# Patient Record
Sex: Female | Born: 1935 | Race: White | Hispanic: No | Smoking: Never smoker
Health system: Southern US, Community
[De-identification: ages and names within clinical notes are randomized; demographics above are authoritative.]

## PROBLEM LIST (undated history)

## (undated) DIAGNOSIS — E039 Hypothyroidism, unspecified: Secondary | ICD-10-CM

## (undated) DIAGNOSIS — M1990 Unspecified osteoarthritis, unspecified site: Secondary | ICD-10-CM

## (undated) DIAGNOSIS — Z7901 Long term (current) use of anticoagulants: Secondary | ICD-10-CM

## (undated) DIAGNOSIS — Z9889 Other specified postprocedural states: Secondary | ICD-10-CM

## (undated) DIAGNOSIS — I25709 Atherosclerosis of coronary artery bypass graft(s), unspecified, with unspecified angina pectoris: Secondary | ICD-10-CM

## (undated) DIAGNOSIS — Z86718 Personal history of other venous thrombosis and embolism: Secondary | ICD-10-CM

## (undated) DIAGNOSIS — I459 Conduction disorder, unspecified: Secondary | ICD-10-CM

## (undated) DIAGNOSIS — E785 Hyperlipidemia, unspecified: Secondary | ICD-10-CM

## (undated) DIAGNOSIS — E119 Type 2 diabetes mellitus without complications: Secondary | ICD-10-CM

## (undated) DIAGNOSIS — M810 Age-related osteoporosis without current pathological fracture: Secondary | ICD-10-CM

## (undated) DIAGNOSIS — R001 Bradycardia, unspecified: Secondary | ICD-10-CM

## (undated) DIAGNOSIS — S72009A Fracture of unspecified part of neck of unspecified femur, initial encounter for closed fracture: Secondary | ICD-10-CM

## (undated) DIAGNOSIS — E7800 Pure hypercholesterolemia, unspecified: Secondary | ICD-10-CM

## (undated) DIAGNOSIS — I10 Essential (primary) hypertension: Secondary | ICD-10-CM

## (undated) DIAGNOSIS — M8580 Other specified disorders of bone density and structure, unspecified site: Secondary | ICD-10-CM

## (undated) DIAGNOSIS — I4892 Unspecified atrial flutter: Secondary | ICD-10-CM

## (undated) DIAGNOSIS — I2510 Atherosclerotic heart disease of native coronary artery without angina pectoris: Secondary | ICD-10-CM

## (undated) HISTORY — PX: CORONARY ARTERY BYPASS GRAFT: SHX141

## (undated) HISTORY — DX: Unspecified osteoarthritis, unspecified site: M19.90

## (undated) HISTORY — PX: JOINT REPLACEMENT: SHX530

## (undated) HISTORY — DX: Other specified disorders of bone density and structure, unspecified site: M85.80

## (undated) HISTORY — DX: Long term (current) use of anticoagulants: Z79.01

## (undated) HISTORY — PX: APPENDECTOMY: SHX54

## (undated) HISTORY — DX: Age-related osteoporosis without current pathological fracture: M81.0

## (undated) HISTORY — DX: Unspecified atrial flutter: I48.92

## (undated) HISTORY — DX: Type 2 diabetes mellitus without complications: E11.9

## (undated) HISTORY — PX: FRACTURE SURGERY: SHX138

## (undated) HISTORY — DX: Atherosclerotic heart disease of native coronary artery without angina pectoris: I25.10

## (undated) HISTORY — DX: Conduction disorder, unspecified: I45.9

## (undated) HISTORY — PX: HEMORRHOID SURGERY: SHX153

## (undated) HISTORY — DX: Atherosclerosis of coronary artery bypass graft(s), unspecified, with unspecified angina pectoris: I25.709

## (undated) HISTORY — DX: Essential (primary) hypertension: I10

## (undated) HISTORY — DX: Pure hypercholesterolemia, unspecified: E78.00

## (undated) HISTORY — DX: Hypothyroidism, unspecified: E03.9

## (undated) HISTORY — PX: EYE SURGERY: SHX253

## (undated) HISTORY — DX: Fracture of unspecified part of neck of unspecified femur, initial encounter for closed fracture: S72.009A

## (undated) HISTORY — DX: Personal history of other venous thrombosis and embolism: Z86.718

## (undated) HISTORY — DX: Hyperlipidemia, unspecified: E78.5

## (undated) HISTORY — DX: Other specified postprocedural states: Z98.890

## (undated) HISTORY — DX: Bradycardia, unspecified: R00.1

---

## 2000-04-24 ENCOUNTER — Encounter: Payer: Self-pay | Admitting: Specialist

## 2000-05-02 ENCOUNTER — Inpatient Hospital Stay (HOSPITAL_COMMUNITY): Admission: RE | Admit: 2000-05-02 | Discharge: 2000-05-06 | Payer: Self-pay | Admitting: Specialist

## 2000-05-02 ENCOUNTER — Encounter: Payer: Self-pay | Admitting: Orthopedic Surgery

## 2001-09-07 ENCOUNTER — Ambulatory Visit (HOSPITAL_COMMUNITY): Admission: RE | Admit: 2001-09-07 | Discharge: 2001-09-07 | Payer: Self-pay | Admitting: Interventional Cardiology

## 2002-04-20 ENCOUNTER — Encounter: Admission: RE | Admit: 2002-04-20 | Discharge: 2002-04-20 | Payer: Self-pay | Admitting: Family Medicine

## 2002-04-20 ENCOUNTER — Encounter: Payer: Self-pay | Admitting: Family Medicine

## 2002-06-03 ENCOUNTER — Other Ambulatory Visit: Admission: RE | Admit: 2002-06-03 | Discharge: 2002-06-03 | Payer: Self-pay | Admitting: Obstetrics & Gynecology

## 2004-07-30 ENCOUNTER — Ambulatory Visit (HOSPITAL_COMMUNITY): Admission: RE | Admit: 2004-07-30 | Discharge: 2004-07-30 | Payer: Self-pay | Admitting: Gastroenterology

## 2005-02-04 ENCOUNTER — Inpatient Hospital Stay (HOSPITAL_BASED_OUTPATIENT_CLINIC_OR_DEPARTMENT_OTHER): Admission: RE | Admit: 2005-02-04 | Discharge: 2005-02-04 | Payer: Self-pay | Admitting: Interventional Cardiology

## 2005-02-27 ENCOUNTER — Ambulatory Visit: Payer: Self-pay | Admitting: Physical Medicine & Rehabilitation

## 2005-02-27 ENCOUNTER — Inpatient Hospital Stay (HOSPITAL_COMMUNITY): Admission: RE | Admit: 2005-02-27 | Discharge: 2005-03-06 | Payer: Self-pay | Admitting: Specialist

## 2007-07-15 ENCOUNTER — Other Ambulatory Visit: Admission: RE | Admit: 2007-07-15 | Discharge: 2007-07-15 | Payer: Self-pay | Admitting: Obstetrics and Gynecology

## 2011-03-15 NOTE — Discharge Summary (Signed)
NAMELENOIR, FACCHINI NO.:  [id number]   MEDICAL RECORD NO.:  [id number]          PATIENT TYPE:  INP   LOCATION:  5035                         FACILITY:  [facility]   PHYSICIAN:  Kerrin Champagne, M.D.   DATE OF BIRTH:  19-Aug-1926   DATE OF ADMISSION:  02/27/2005  DATE OF DISCHARGE:  03/06/2005                                 DISCHARGE SUMMARY   ADMISSION DIAGNOSES:  1.  Severe right knee osteoarthritis.  2.  Hypertension.  3.  Hypothyroidism.  4.  Diabetes mellitus.  5.  Coronary artery disease.  6.  Gastroesophageal reflux.  7.  Status post left total knee arthroplasty.   DISCHARGE DIAGNOSES:  1.  Severe right knee osteoarthritis.  2.  Hypertension.  3.  Hypothyroidism.  4.  Diabetes mellitus.  5.  Coronary artery disease.  6.  Gastroesophageal reflux.  7.  Status post left total knee arthroplasty.  8.  Postoperative anemia.   PROCEDURE:  On Feb 27, 2005, the patient underwent right cemented [employer]  total knee arthroplasty. This was performed by Dr. Otelia Sergeant, assisted by Maud Deed, P.A.C. under general anesthesia.   CONSULTATIONS:  [facility].   BRIEF HISTORY:  The patient 77 year old female with chronic and progressive  right knee pain. Conservative management is no longer giving her any  comfort. She has utilized anti-inflammatory medications as well as a intra-  articular injections and Synvisc injections. She is now having night pain  and inability to ambulate any distance. Radiographs demonstrate severe  medial joint line narrowing involving the right knee with varus deformity  and loss of extension of nearly 10 degrees with flexion only to 100 degrees.  It was thought that she would require surgical intervention and was admitted  for the procedure as stated above.   BRIEF HOSPITAL COURSE:  The patient tolerated the procedure under general  anesthesia without complications. She was placed on Coumadin for DVT and PE  prophylaxis  postoperatively. Adjustments in Coumadin dose were made  according to daily pro times by the pharmacist at [facility]. She  was placed on the usual physical therapy routine for ambulation and gait  training as well as range of motion and strengthening exercises. She was  allowed weightbearing as tolerated on the operative extremity. The patient's  Hemovac drain was discontinued on the first postoperative day and her  dressing was changed daily thereafter. No significant drainage noted. The  patient utilizes CPM machine for range of motion and tolerated this well.   The patient was started on Protonix for her reflux. Her blood sugars were  not felt to be stable and therefore a consult was obtained from the  [facility]. Adjustments were made in her oral agents. She did not  require use of a sensitive sliding scale insulin. The patient was initially  slow with her therapy and a rehabilitation consult was obtained.  Fortunately, as she began feeling better, her activity increased as well.  The patient dropped her hemoglobin to 7.7 with hematocrit 31 requiring 3  units of packed red blood cells. Doppler studies were obtained of the  right  lower extremity secondary to edema which were negative. The patient did have  a local cellulitis at the left wrist and was treated with oral Keflex.   On Mar 06, 2005, the patient was afebrile. She was voiding well and taking  oral fluids well. Blood sugars were stabilizing. She was felt stable for  discharge to her home.   PERTINENT LABORATORY VALUES:  Admission CBC within normal limits. Hemoglobin  12.8, hematocrit 37.3 on admission. Hemoglobin dropped to the lowest value  of 7.7 with hematocrit 21.7. The patient was transfused with 3 units of  packed red blood cells and results of hemoglobin and hematocrit were then  noted to be 11.5 and 32.9. Coagulation studies on admission were within  normal limits. At discharge, INR was 2.5.  Chemistry studies on admission  were normal with exception of glucose 208. Her blood sugars did range from  218-155 during the hospital stay. Hemoglobin A1c was noted to be 7.3. The  patient did have a couple episodes of hyponatremia as well as hypokalemia  which was treated with IV fluids and supplementation. Urinalysis on  admission was with too numerous to count bacteria, positive nitrite. Repeat  on Feb 28, 2005 was negative for urinary tract infection. Urine culture was  without growth. Chest x-ray on Mar 04, 2005 negative chest for active disease  and borderline heart size. Mar 01, 2005, chest x-ray showed diminished  aeration minimally, markings at the left lung base, probable atelectasis,  cardiomegaly. EKG on admission showed sinus rhythm with premature  ventricular complexes or fusion complexes, ST and T-wave abnormality;  consider inferolateral ischemia, abnormal EKG confirmed by Dr. Jenne Campus.  Doppler studies on Mar 04, 2005 were negative for DVT.   CONDITION ON DISCHARGE:  Stable.   PLAN:  The patient was given prescriptions for Glucophage, Keflex, enteric  coated aspirin, K-Lor, Trinsicon, Vicodin, and Protonix to be used as  directed. She will continue with ice to her knee and will stop using her  knee immobilizer. She will utilize analgesics for discomfort. She is  encouraged to walk as tolerated. She is to work on active range of motion  with physical therapy. The patient will follow up with Dr. Otelia Sergeant two weeks  from the date of her surgery. She will be allowed to shower with daily  dressing changes done at home. Home health physical therapy will assist her  with an exercise routine. She will continue on a diabetic diet. We will see  back in two weeks from the date of her surgery. All questions encouraged and  answered.      Wende Neighbors, P.A.      Kerrin Champagne, M.D.  Electronically Signed   SMV/MEDQ  D:  07/25/2005  T:  07/25/2005  Job:  [id number]

## 2011-03-15 NOTE — Consult Note (Signed)
[name], [name]              ACCOUNT NO.:  [id number]   MEDICAL RECORD NO.:  [id number]          PATIENT TYPE:  INP   LOCATION:  5035                         FACILITY:  [facility]   PHYSICIAN:  Toby L. Fugate, D.O.   DATE OF BIRTH:  June 10, 1926   DATE OF CONSULTATION:  DATE OF DISCHARGE:                                   CONSULTATION   DATE OF CONSULTATION:  Mar 03, 2005.   HISTORY OF PRESENT ILLNESS:  [name] is a 77 year old Caucasian female  with severe osteoarthritis admitted for a scheduled right total knee  arthroplasty on May 3rd.  The patient tolerated the procedure well.  Her  postop course has been essentially uneventful.  However, the patient has  continually had increased blood glucose.  Her blood sugars have ranged from  the mid 100s up to the mid 200s.  She does have a history of diabetes type 2  for which she receives Metformin 500 mg p.o. b.i.d.  She says that in the  past while at home, her blood sugars have been controlled with this regimen  of Metformin.  She says that usually her blood sugars range in the mid 100s.  In addition to medication, the patient exercises regularly and follows a low  sugar diet.  In addition to the uncontrolled blood sugars, the patient did  have a fever yesterday of 101.8.  The patient denies any shortness of breath  or cough.  In addition, she denies any urinary symptoms.  The wound shows no  evidence of infection.  She denies headache, changes in vision, sore throat,  and diarrhea.   PAST MEDICAL HISTORY/PAST SURGICAL HISTORY:  1.  Coronary artery disease status post CABG (four vessels) in 1993.  2.  Hypercholesterolemia.  3.  Hypothyroidism.  4.  Osteoarthritis.  5.  Diabetes type 2.  6.  Left total knee arthroplasty in 2001.  7.  Right total knee arthroplasty this admission.  8.  Hysterectomy.  9.  Appendectomy.   MEDICATIONS:  1.  Amlodipine 5 mg p.o. daily.  2.  Colace 100 mg p.o. b.i.d.  3.  Hydrochlorothiazide 25 mg  p.o. daily.  4.  Sliding scale insulin.  5.  Synthroid 88 mcg p.o. daily.  6.  Metformin 500 mg p.o. b.i.d.  7.  Lopressor 500 mg p.o. daily.  8.  Protonix 400 mg IV daily.  9.  Zocor 5 mg p.o. daily.  10. Warfarin per pharmacy protocol.   ALLERGIES:  No known allergies.   SOCIAL HISTORY:  The patient is widowed.  She has four daughters, one of  which is mentally retarded.  The patient denies tobacco, alcohol, and IV  drug abuse.   FAMILY HISTORY:  Father died at age 75 due to influenza.  Mother died at age  93 due to a stroke.   REVIEW OF SYSTEMS:  A complete 12-point review of systems was obtained and  the review was negative except as stated in the HPI.   PHYSICAL EXAMINATION:  VITAL SIGNS:  T-max 101.8, T-current 98.1, pulse 85,  respiratory rate 20, blood pressure 127/72.  GENERAL:  The patient appears alert and oriented.  She is in no acute  distress.  HEENT:  Pupils were equally round and reactive to light.  Extraocular  muscles were intact.  There was no scleral icterus.  Tympanic membranes were  clear bilaterally, no erythema.  Oropharynx clear, moist, no erythema or  thrush.  NECK:  No JVD, no carotid bruit, no adenopathy.  HEART:  Regular rate and rhythm.  There is a 2/6 systolic ejection murmur  with radiation to the patient's axilla.  LUNGS:  Faint right lower lobe crackles.  Otherwise clear.  ABDOMEN:  Positive bowel sounds, nontender, nondistended.  EXTREMITIES:  Right knee incision site is clean.  There is no erythema.  There is 1+ edema bilaterally.  NEUROLOGICAL:  Cranial nerves II-XII are grossly intact.  There were no  focal deficits.  DTRs were 2/4 in all extremities.  Strength was 5/5 in all  extremities.   LABORATORY DATA:  White blood cell count 14.6, hemoglobin 8.5, hematocrit  24, platelets 170.  Sodium 135, potassium 3.6, chloride 104, CO2 26, BUN 13,  creatinine 1, glucose 204.  INR 2.4.   ASSESSMENT AND PLAN:  1.  Diabetes type 2.  At this  point, I will increase the patient's Metformin      to 850 mg p.o. b.i.d.  The patient wants to avoid adding any new      medications if possible.  I will check Accu-Cheks q.a.c. and q.h.s.  I      will cover her with a sensitive sliding scale insulin.  I will also      check a hemoglobin A1c.  2.  Fever and leukocytosis.  The etiology is unclear at this point.  There      are no focal symptoms.  She denies any shortness of breath or cough.      Her wound shows no evidence of infection.  She denies dysuria and      diarrhea.  If she spikes a fever again, I will order CBC, chest x-ray,      blood cultures, UA, and urine cultures.  3.  Hypercholesterolemia.  I will continue the patient on her current dose      of Zocor.  4.  Coronary artery disease.  I will continue the patient on her current      medicine regimen.  She denies any chest pain.  5.  Hypothyroidism.  The patient is currently on Synthroid.  I will continue      that regimen.      TLF/MEDQ  D:  03/03/2005  T:  03/03/2005  Job:  [id number]

## 2011-03-15 NOTE — Op Note (Signed)
NAMESHAKARA, TWEEDY NO.:  [id number]   MEDICAL RECORD NO.:  [id number]          PATIENT TYPE:  INP   LOCATION:  2899                         FACILITY:  [facility]   PHYSICIAN:  Kerrin Champagne, M.D.   DATE OF BIRTH:  20-Aug-1926   DATE OF PROCEDURE:  02/27/2005  DATE OF DISCHARGE:                                 OPERATIVE REPORT   PREOPERATIVE DIAGNOSIS:  Severe right knee osteoarthritis.   POSTOPERATIVE DIAGNOSIS:  Severe right knee osteoarthritis.   PROCEDURES:  Right cemented Osteonics total knee arthroplasty using a  Scorpio posterior cruciate sacrificing prosthesis with #7 femoral stem and  #7 femoral component and #7 tibial components, 12 mm flexed tibial tray and  a 26 mm patella.   SURGEON:  Kerrin Champagne, M.D.   ASSISTANT:  Wende Neighbors, P.A.   ANESTHESIA:  GOT by Dr. Gypsy Balsam supplemented with right femoral nerve block.   SPECIMENS:  None.   ESTIMATED BLOOD LOSS:  100 mL.   TOURNIQUET TIME:  Total tourniquet time at 280 mmHg was 1 hour and 30  minutes.   COMPLICATIONS:  None.  The patient returned to the PACU in satisfactory  condition.   HISTORY OF PRESENT ILLNESS:  The patient is a 77 year old female who has  been experiencing worsening right knee discomfort.  She has undergone  attempts at conservative management, including anti-inflammatory agents and  use of Synvisc, all without apparent relief of discomfort.  Experiencing  night pain, difficulty with standing and ambulation on the right leg,  popping, cracking sensation and difficulty starting to walk.  She is unable  to negotiate stairs due to pain.  Her radiographs have demonstrated severe  medial joint line narrowing involving the right knee with varus deformity,  loss of extension of nearly 10 degrees and flexion only 100 degrees.  She is  brought to the operating room for problems with severe right knee  osteoarthritis recalcitrant to conservative management.   INTRAOPERATIVE  FINDINGS:  As above.   DESCRIPTION OF PROCEDURE:  After adequate general anesthesia, the right  lower extremity with a lateral block at the upper thigh, tourniquet about  the right upper thigh and a foot holder at the base of the bed, the patient  underwent a standard prep with DuraPrep solution and preoperative  antibiotics.  Draped in the usual manner.  An iodine Vi-Drape was used.   The patient had marking out of the expected area for incision, elevation of  the leg and exsanguination with Esmarch bandage.  Tourniquet inflated to 280  mmHg.  A medial parapatellar incision in line with the median portion of the  knee through the skin and subcutaneous layers, total length 14 cm, through  the skin and subcutaneous layers, extending down to the quadriceps tendon  proximal into the patella and then incision straight through the external  retinaculum, preserving the peritenon layer for later reapproximation.  This  was then carried medially.  An incision was made into the quadriceps tendon  along its medial one-third.  Incision carried along the medial parapatellar  region directly into the knee joint through  the synovial layer, preserving a  cuff or reattachment, then along the medial border of the patellar tendon  directly down to proximal tibial along the medial aspect of the patellar  tendon to the patella and anterior tibial tubercle.  Both the medial and  lateral attachments into the proximal tibial were carefully developed,  excising the meniscus anteriorly along the medial meniscus and then along  the anterior and aspects of the lateral meniscus, debriding the posterior  patellar fat pad of about 50% of its fat.  The incision was carried into the  quadriceps mechanism proximally until the knee cap was able to be everted  and delivered laterally.  The knee was able to be brought into flexion at  this point.  Osteophytes were resected of both lateral and medial femoral  condyles within  the intercondylar notch.  A drill was then used to drill  into the distal intermedullary canal of the femur.  Then basically a canal  finder was used to find the canal.  Using an alignment guide, then the  distal cutting alignment guide was then placed in approximately 5 degrees of  valgus.  This then carefully aligned into the external rotation of about 10  degrees and pinned to the distal femur.  The intermedullary guide was then  removed and the transverse cut made, an approximately +2 cut of about 10 mm  off of the end of the femur.  The end of the femur was then measured and  measured exactly for a #7 component so that the end of the femur was then  marked with both the medial and lateral femoral condyle marked for placement  of the alignment guide for cutting of the coronal cuts and chamfer cut  block.  The block was then impacted onto the end of the femur and held in  place with clamps bilaterally and cutting then performed, first to the  anterior coronal cut and posterior coronal cuts, protecting the soft tissue  structures posteriorly while incising with an oscillating saw.  Posterior  chamfer cut then cut and then the anterior chamfer cut.  These provided  excellent blocks for cutting.  The block used for cutting was then removed  and evaluation determined that cuts were appropriate.   Attention then turned to the proximal tibia.  The anterior and posterior  cruciates were resected and the tibia able to be subluxed anteriorly using  McHale retractors.  The lateral aspect of the tibial plateau was able to be  exposed.  The menisci that were retained were resected both medially and  laterally without difficulty.  The popliteus tendon was preserved quite  nicely.   With this then the tibial surface was then carefully debrided of its tibial  tubercles and median eminence using an oscillating saw.  A #7 tray was then used to determine the location for placement of the drill hole in  the  proximal tibia for placement of the intermedullary guide.  First the  handheld canal finder was used to find the canal.  This was then irrigated  to provide adequate removal of the fat from the intermedullary region.   The alignment guide was then placed with the proximal tibial cutting guide  set at 0 degrees.   This was then aligned using the alignment rod, bisecting the ankle distally,  providing a correct degree of internal rotation.  The patient's proximal  tibial guide was then pinned to the proximal tibia in the correct position  and alignment 4  mm to be cut off of the lateral tibial plateau where the  patient had more bone than medially where it had been eroded by medial joint  line arthritis.  With the cutting guide then pinned into place and  approximated, all of the soft tissue structures were carefully protected and  the proximal tibial cut was performed using oscillating saw.  This was made  quite nicely.  The proximal tibial guide was then removed as were the pins  using its placement.   The distal femoral intercondylar cut was then made by placing the cutting  jig over the end of the femur and impacting it into place and then pinning  it appropriately.  Using the chisels to remove the anterior bone from the  anterior intercondylar notch region, then the distal intercondylar notch was  cut using first the osteotomes using a Leksell rongeur to remove any loose  bone and the impacting the remaining bone into the end of the femur into the  intermedullary canal hole region.  With this completed, then a trial was  performed using a #7 femoral component and a #7 tibial tray, first with a 10  mm and then a 12 mm trace.  The 12 mm provided excellent fit with good  maintenance of ligament stability, full extension and flexion to 110  degrees.  The patella was then cut using the patella cutting guides and a  reamer for 26 mm patellar implant.  This was carefully aligned to  allow for  medialization, but also to allow for enough adequate bone about the patellar  implant to keep it stable.  The reaming was performed to a 10 mm depth.  Then each of the drill holes were made, two medial and one lateral.  This  was then removed and the prosthesis placed.  Then bone and cartilage were  resected about the implant to ensure that adequate resection of the patella  had been performed in order to allow for the patellar implant to glide  nicely on the femoral implant surface.  With this completed, then  determination of the correct orientation of the tibial implant was  performed, this using the alignment guide, again bisecting the ankle  distally, ensuring that the tibial tray was correctly onto the tibial  plateau.  It seated quite well.  Flexion and extension maneuvers  demonstrated the plate not lifting off at any particular instance.  Therefore, the knee was brought into the full extension again with the alignment in place, bisecting the ankle.  Marks were then made over the  anterior aspect of the tibial tray where the alignment notches were present  for the permanent guide, as well as the next implant and placement of the  proximal tibial flange osteotome cutting mechanism.  The knee was then  brought into flexion.  Tibial tray removed.  Femoral component removed.  Patellar component removed.  The knee was flexed and the McHale's replaced  so that the proximal portion of the tibial was well exposed in a subluxed  manner.  The guide was then pinned to the proximal portion of the tibia in  the correct degree of rotation determined previously with the implants just  removed and the correct rotation placed.  The tibial tray was then carefully  pinned to the proximal portion of the tibia.  The upright cutting guide for  use of the flange osteotomes were then inserted.  Each of them were then  impacted into place up to a #7-9 cement  tibial tray.  This completed, the   upright rig along with the proximal tibial tray were removed.  Pins removed  appropriately.  Irrigation was then performed.  Careful inspection of the  posterior joints demonstrated no significant osteophytes retained over the  posterior portions of the femoral condyles.  Soft tissue debrided of any  loose debris posteriorly and laterally.  Cauterization performed in the area  of the expectorate geniculate vessels both medial, lateral and central  perforating.   Irrigation was then performed using pulse irrigant solution over the  subcutaneous layers, extending to the patellar recessed area and inside the  knee both over the femur and tibia, removing any loose debris and soft  tissue present with this.  After irrigation was completed, drying was  completed using clean, dry sponges.  Knee brought into flexion.  McHales  then replaced to allow for subluxation of the tibia.  Cement was mixed when  the permanent prostheses, both the #7 tibial and femoral components, were in  place, as well as the 26 mm patellar piece.  Following the mixing of cement  and it being in a semi-solid consistency, a putty consistency, cement was  then first placed into the area of the patella after marking the patella for  the correct orientation of the patellar pegs.  Once cement was in place,  additional cement was placed over the posterior aspect of the patella.  This  was then inserted into place and impacted into place.  Excess cement removed  from the circumference of the patellar implant interval.  Then the patellar  clamp was applied and pressure was applied to the patellar component,  squeezing additional cement out and removing this appropriately.  Attention  was then turned to the subluxed tibial area where drying was completed.  Cement was placed over the proximal tibia within the open holes for the peg  and the flanges.  This was carefully spread out and then the #7 cement tibial component then impacted  into place.  Excess cement from the  circumference of the proximal tibia was removed using [employer].  With  this completed, a 10 mm trial tibial tray was then placed and the knee then  relocated with the tibia  under the femur.  The end of the femur was then  exposed using retractors so that all soft tissue was removed from the cut  surface of the end of the femur.  Bone cement was then applied to the  anterior surface of the distal tibia, the distal area, as well as the  posterior chamfer cut region.  Additional cement had been applied to the  posterior tray so that the femoral implant for its implantation.  The  femoral component was then applied to the femur.  Careful attention was  placed on where the holes were at the end of the femur for the proper  alignment so these could be found and oriented.  The femur component was  then impacted into place.  The insertion device was removed.  Additional  polypropylene impactor was used to further impact the implant into good  position and alignment.  Excess cement was then removed from the  circumference of the femoral component, as well as the intercondylar notch  and the anterior surface of the implant and femur.  This was done carefully.  Once it was completed, then the knee was brought into full extension and  left in extension until cement had completely hardened.  This provided good  compression of the knee joint and impaction of the knee joint until the  cement had completely hardened.  Once the cement had completely hardened,  the knee was brought into extension.  Examination of the knee demonstrated  that the cement had hardened quite nicely.  There was no loose debris or  significant cement remaining that required debridement.   The range of motion of the knee was quite good.  Full extension and flexion  to 120 degrees.  The patellar component demonstrated no tendency to lateral  subluxation with single thumb pressure flexion  and extension.  With this  completed, the trial tibial tray was removed.  The posterior aspect of the  knee was hemostased using two portions of thrombin-soaked Gelfoam.  Then a  sponge was placed.  Following placement of the sponge, the tourniquet was  released.  The total tourniquet time was 1 hour and 30 minutes.  Holding the  sponges in place for a total of five minutes.  Then hemostasis was further  obtained beginning from superficial to deep using cauterization, cauterizing  small blood vessels and bleeders, posteriorly along the genicular area, this  also was carefully cauterized.  Small perforating genicula noted over the  posterior aspect of the knee joint.  This was cauterized was the lateral  genicular region.  There was no significant active bleeding present at the  end of hemostasis.  Bone wax was applied to the intercondylar notch region  of the distal femur to obtain further hemostasis.  A single Hemovac drain was placed in the depths of the incision, exiting out the anterolateral  aspect of the distal thigh.  The permanent prosthesis, a 12 mm insert, was  then brought onto the field after a trial with a 12 provided excellent  fixation and excellent range of motion with full extension and flexion to  120 degrees of stable medial and lateral knee stability.  With this then,  the permanent prosthesis was inserted into place and impacted into place,  seating it normally.  Inspection of the joint surface and the tray tibial  implant interval demonstrated to be excellent and well seated.  There was no  evidence of loosening here.  Hemostasis was quite nice.  There was no  significant bleeding for the remainder of the case.  With the Hemovac drain  in place, then the synovial layer was closely approximated with a running  stitch of 0 Vicryl suture.  The distal quadriceps tendon was approximated  with interrupted 1-0 Vicryl sutures.  As well, the medial retinaculum of the  knee was  approximated with interrupted 1-0 Vicryl sutures.  The patient's  external retinaculum of the knee and peritenon layer were approximated with  interrupted 0 and 2-0 Vicryl sutures.  The deep subcutaneous layers were  approximated with interrupted 0 and 2-0 Vicryl sutures.  The skin was closed  with a running subcuticular stitch of 4-0 Vicryl.  Tincture of Benzoin and  Steri-Strips applied and 4 x 4s and ABD pad were affixed to the skin with a  Kerlix and then Ace wrap applied from the patient's right foot to the upper  thigh.  Knee immobilizer was placed.  The patient was then returned to her  bed, reactivated, extubated and returned to the recovery room in  satisfactory condition.  All instrument and sponge counts were correct.      JEN/MEDQ  D:  02/27/2005  T:  02/27/2005  Job:  [id number]

## 2011-03-15 NOTE — Op Note (Signed)
NAME:  [name], [name] NO.:  [id number]   MEDICAL RECORD NO.:  [id number]          PATIENT TYPE:  AMB   LOCATION:  ENDO                         FACILITY:  [facility]   PHYSICIAN:  Graylin Shiver, M.D.   DATE OF BIRTH:  Aug 27, 1926   DATE OF PROCEDURE:  07/30/2004  DATE OF DISCHARGE:                                 OPERATIVE REPORT   PROCEDURE:  Colonoscopy.   INDICATIONS:  Screening.   Informed consent was obtained after explantation of the risks of bleeding,  infection, and perforation.   PREMEDICATIONS:  1.  Fentanyl 50 mcg IV.  2.  Versed 3 mg IV.   PROCEDURE:  With the patient in the left lateral decubitus position, a  rectal exam was performed.  No masses were felt. The Olympus colonoscope was  inserted into the rectum and advanced around a tortuous colon to the cecum.  Cecal landmarks were identified.  The cecum and ascending colon were normal,  the transverse colon normal.  The descending colon and sigmoid showed  extensive diverticulosis.  The rectum was normal.  She tolerated the  procedure well, without complications.   IMPRESSION:  Extensive diverticulosis of the left colon.      SFG/MEDQ  D:  07/30/2004  T:  07/30/2004  Job:  [id number]   cc:   Chales Salmon. Abigail Miyamoto, M.D.  [address]  [place]  [place] [address]  Fax: [phone]

## 2011-03-15 NOTE — H&P (Signed)
[facility]  Patient:    [name], [name]                       MRN: [id number] Adm. Date:  05/02/00 Attending:  Kerrin Champagne, M.D. Dictator:   Druscilla Brownie. Shela Nevin, P.A. CC:         Celso Sickle, M.D., [facility]             Chales Salmon. Abigail Miyamoto, M.D.                         History and Physical  DATE OF BIRTH:  17-May-1926  CHIEF COMPLAINT:  Pain in my knees, more so in the left than right.  HISTORY OF PRESENT ILLNESS:  This 77 year old lady has been seen by [place] for continuing problems concerning her knees.  She has been seen on and off over the years.  Glucosamine and chondroitin, as well as anti-inflammatories have been used.  Unfortunately, she really has not progressed with reduction of her knee pain.  She has developed severe varus deformities involving the medial compartment, as well as the patellofemoral joint.  Injections really have not helped as well.  Examination reveals crepitus range of motion, some mild synovitis and effusion.  X-rays have shown osteoarthritis with near bone-on-bone deformity in the medial joint, as well as the patellofemoral joint.  Due to these positive findings, it was felt that this patient would benefit from surgical intervention and is being admitted for total knee replacement arthroplasty of the left knee.  She has donated 2 units of autologous blood for this procedure.  Dr. Abigail Miyamoto is aware of this patients upcoming knee surgery and Dr. Verdis Prime has cleared her for surgery.  PAST MEDICAL HISTORY:  This lady has really been in good health throughout her lifetime.  She did have a CABG in 1993 with excellent results.   She has had appendectomy, hysterectomy, and hemorrhoidectomy.  She has hypertension and hypothyroidism.  CURRENT MEDICATIONS:  1. Lopressor 50 mg 1 q.d.  2. Synthroid 0.088 mcg per day (mg ?).  3. Zocor 5 mg 1 q.d.  4. Premarin 0.625 mg 1 q.d.  5. Multivitamin, aspirin,  and vitamin E.  She will stop the aspirin and vitamin E prior to surgery.  SOCIAL HISTORY:  No intake of tobacco or alcohol products.  She does have a handicapped child at home.  Her daughter, Tacey Ruiz, will be her primary spokesperson during this hospitalization.  FAMILY HISTORY:  Noncontributory.  ALLERGIES:  No known drug allergies.  REVIEW OF SYSTEMS:  CNS: No seizure disorder, paralysis, numbness, or double vision.  Respiratory:  No productive cough.  No hemoptysis.  No shortness of breath.  Cardiovascular: No chest pain.  No angina.  No orthopnea. Gastrointestinal:  No nausea, vomiting, melena, or bloody stools. Genitourinary:  No discharge, dysuria, or hematuria.  Musculoskeletal: Primarily in present illness with her knees.  PHYSICAL EXAMINATION:  GENERAL:  Alert, cooperative, friendly, 77 year old, white female who is accompanied by her daughter.  VITAL SIGNS:  Blood pressure 138/88, pulse 74, respirations 12.  HEENT:  Normocephalic.  PERRLA.  Oropharynx is clear.  CHEST:  Clear to auscultation.  No rhonchi.  No rales.  No wheezes.  HEART:  Regular rate and rhythm.  No murmurs are heard.  ABDOMEN:  Soft and nontender.  Liver and spleen not felt.  GENITALIA, RECTAL, PELVIC, BREASTS:  Not done.  Not pertinent  to present illness.  EXTREMITIES:  Left knee as in present illness above.  ADMITTING DIAGNOSES:  1. Left knee osteoarthritis.  2. Status post coronary artery bypass grafting.  3. Hypothyroidism.  4. Hypertension.  PLAN:  The patient will be admitted for total knee replacement arthroplasty to the left knee.  We will ask Dr. Abigail Miyamoto and Dr. Katrinka Blazing to follow along with [place] should the patient have any medical or cardiology problems.  We will see how the patient does during her acute hospitalization to see if she needs a rehab consult or to have home physical therapy.  She has donated 2 units of autologous blood for this procedure. DD:  04/24/00 TD:   04/24/00 Job: [id number]

## 2011-03-15 NOTE — Cardiovascular Report (Signed)
[place]. [facility]  Patient:    [name], [name] Visit Number: [id number] MRN: [id number]          Service Type: Attending:  Darci Needle, M.D. Dictated by:   Darci Needle, M.D. Proc. Date: 09/07/01   CC:         Chales Salmon. Abigail Miyamoto, M.D.             Gwenith Daily Tyrone Sage, M.D.                        Cardiac Catheterization  INDICATION FOR PROCEDURE: Recent Cardiolite study demonstrating new apical ischemia.  PROCEDURES PERFORMED: 1. Left heart catheterization. 2. Selective coronary angiography. 3. Bypass graft angiography. 4. Internal mammary artery angiography.  DESCRIPTION OF PROCEDURE: After informed consent, a 6 French sheath was started in the right femoral artery using a modified Seldinger technique.  A 6 French A2 multipurpose catheter was used for hemodynamic recordings, left ventriculography and selective left and right coronary angiography. A multipurpose catheter was also used for saphenous vein graft angiography. We used an internal mammary artery catheter for a nonselective visualization of the left internal mammary.  The patient tolerated the procedure without complications.  RESULTS:  I: HEMODYNAMIC DATA:     a. The aortic pressure was 163/76 mmHg.     b. Left ventricular pressure 163/17 mmHg.  II: LEFT VENTRICULOGRAPHY: The left ventricle is normal in size. There is an inferior wall hypokinesis. No MR is noted. EF is estimated to be 50%.  III: CORONARY ANGIOGRAPHY:     a. Left main coronary: Widely patent.     b. Left anterior descending coronary artery: The LAD is large. After        the second septal perforator there is a 95-99% stenosis. Faint        competitive flow is noted with the distal internal mammary artery        graft. The first diagonal arises before the second septal perforator        and contains a 95% mid vessel stenosis. This diagonal is very small.        The second diagonal branch is also small and  arises from within the        99 plus percent stenosis in the mid LAD.     c. Circumflex artery: The circumflex coronary artery is totally occluded        after a small second obtuse marginal. The first obtuse marginal is        large and supplied by a side-to-side anastomosis from a saphenous vein        graft. Competitive flow is noted. The distal circumflex is not        visualized beyond the small second obtuse marginal.     d. Right coronary artery: The right coronary artery contains a segmental        99% stenosis in then mid vessel. There is distal right coronary        competitive flow with the bypass graft.  IV: BYPASS GRAFT ANGIOGRAPHY:     a. Saphenous vein graft sequential to the first and third obtuse        marginals: This graft is widely patent. The distal third obtuse        marginal contains severe diffuse disease with a 95-99% stenosis        near the apical segment.     b. Saphenous vein graft  to the right coronary artery: This graft is        widely patent with minimal if any irregularities and supplies the        distal right coronary which is also free of any significant        obstruction.  V: LEFT INTERNAL MAMMARY ARTERY GRAFT TO LEFT ANTERIOR DESCENDING: Because of proximal tortuosity in the left subclavian this graft was never selectively cannulated but is widely patent and supplies the very apical portion of the LAD. No significant obstruction is noted.  CONCLUSIONS: 1. Patent saphenous vein grafts. 2. Patent left internal mammary artery graft. 3. Severe native vessel coronary disease with essential total occlusion of the    mid left anterior descending, the mid right coronary and the mid    circumflex. 4. There is development of significant high-grade diffuse disease in the    distal third obtuse marginal that supplies the apical region.  This    obstruction is beyond the graft insertion site. There is also significant    mid diagonal #1 disease.  This  is a relatively small caliber vessel. 5. Overall normal left ventricular function.  RECOMMENDATIONS: Continued medical therapy. The distal third obtuse marginal would be accessible via the saphenous vein graft but at this point, that does not seem like a reasonable option of this patient who is asymptomatic. The diagonal would be relatively small and difficult to keep open due to restenosis. Dictated by:   Darci Needle, M.D. Attending:  Darci Needle, M.D. DD:  09/07/01 TD:  09/07/01 Job: [id number]

## 2011-03-15 NOTE — H&P (Signed)
[place]. [facility]  Patient:    [name], [name] Visit Number: [id number] MRN: [id number]          Service Type: CAT Location: [facility] 2856 01 Attending Physician:  Lyn Records. Iii Dictated by:   Anselm Lis, N.P. Admit Date:  09/07/2001   CC:         Chales Salmon. Abigail Miyamoto, M.D.   History and Physical  DATE OF BIRTH:  Oct 16, 2026.  PRIMARY CARE [facility] [place]:  Chales Salmon. Abigail Miyamoto, M.D.  IMPRESSION: (as dictated by Dr. Verdis Prime) 1. Coronary atherosclerotic heart disease; status post coronary    artery bypass graft x 4 by Dr. Tyrone Sage in 1993.  Left internal    mammary artery to the left anterior descending, sequential saphenous    vein graft to the OM1 and OM3, saphenous vein graft to the distal    right coronary artery.  She suffered heart attack prior to that    bypass procedure (inferior myocardial infarction).  The patient has    been asymptomatic without anginal or congestive heart failure    complaints.  Recent surveillance Cardiolite (08/25/01) revealed    old lateral wall myocardial infarction but new apical ischemia, normal    ejection fraction of 65%. 2. History of dyslipidemia; on Zocor management by Dr. Abigail Miyamoto.  Her    cholesterol was 157 in February of this year. 3. Hypothyroidism; supplemented. 4. Arthritis particularly affecting her knees. 5. Hyperglycemia, diet controlled.  The CBG is 168 today.  PLAN:  (as dictated by Dr. Verdis Prime).  The patient has been counseled, undergone and accepted plans for coronary angiography and bypass graft angiography with possible percutaneous intervention on native or bypass graft if indicated and able.  The risks, potential complications, benefits and alternatives of the procedure were discussed in detail.  Ms. Daoud indicates her questions and concerns have been addressed and is agreeable to proceed.  PAST MEDICAL HISTORY:  As above.  PAST SURGICAL HISTORY: 1. Left total knee replacement in  July of 2001 by Dr. Otelia Sergeant. 2. Hysterectomy with bilateral salpingo-oophorectomy. 3. Hemorrhoidectomy. 4. Appendectomy.  ALLERGIES:  No known drug allergies.  Okay with seafood, shellfish and denies any penicillin.  MEDICATIONS: 1. Zocor on current dosage but was on 5 mg per day in January of this    year. 2. Lopressor 50 mg one and a half tab p.o. b.i.d. 3. Synthroid.  The patient is currently on another dosage but was on    0.075 mg per day January of this year. 4. Enteric coated aspirin 325 mg once daily. 5. Vitamin C, B12, B6, E, magnesium, calcium once daily.  Also    Lecithin once daily.  SOCIAL HISTORY:  Tobacco negative.  ETOH negative.  Caffeine negative.  The patient has been widowed for three years.  She has four daughters one of whom is severely mentally retarded and resides with the patient.  They do have outside help.  The patient is retired from working Sports coach type work.  FAMILY HISTORY:  The father died age 110 after a flu.  The mother died age 102 complications subsequent to stroke, may have had myocardial infarction.  One brother has Alzheimers disease and lives in the nursing home.  One sister age 60 alive and well.  One brother died age 73 of a heart attack.  One brother died in infancy of pneumonia.  REVIEW OF SYSTEMS:  As per HPI.  Denies problems with light headedness, dizziness, near syncope or syncopal events.  Negative dysphagia food or food fluid.  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  Does wear glasses.  Good dentition.  No dysphagia to food or fluid.  Negative constipation, diarrhea, melena or bright red blood.  Negative dysuria no hematuria.  Arthritis predominantly affecting her knees.  Claims of episodic shortness of breath with heavy exertion but she does not really tax herself too much.  Denies orthopnea or PND.  Has early awakening insomnia for which she takes Tylenol P.M.  with relief.  PHYSICAL EXAMINATION:  (as performed by Dr. Verdis Prime).  VITAL SIGNS:  Blood pressure 147/74, heart rate 65 and regular, respiratory rate 12, temperature 98.2, she weighs 171 pounds.  GENERAL:  This is an obese, pleasantly conversant elderly female in no acute distress.  Her daughter is in attendance.  HEENT:  Brisk bilateral carotid upstrokes without bruits.  NECK:  No jugular venous distension or thyromegaly.  CHEST:  Soft end inspiratory crackles.  Negative CPA tenderness.  CARDIAC:  Regular rate and rhythm without murmurs, rubs, or gallops.  Normal S1 and S2.  ABDOMEN:  Soft, nondistended, normoactive bowel sounds.  Negative abdominal aorta and no left femoral bruit.  No masses or organomegaly appreciated.  EXTREMITIES:  Distal pulses intact, negative pedal edema.  NEUROLOGIC:  Cranial nerves 2-12 are grossly intact.  The patient is alert and oriented x 3.  GENITAL AND RECTAL:  Examination deferred.  LABORATORY TESTS:  EKG (undated), sinus brady at 54 beats per minute with evidence of T-wave and "strain" v1 through 2, 1, aVL.  Lab work from today reveals hemoglobin 13.3, hematocrit 39, WBC 9.6, platelets of 241, pro time of 13.6, INR 1.1, PTT of 28.  Sodium 140, K 4.4, chloride 108, CO2 26, BUN 20, creatinine 0.8 and glucose elevated at 168.  Chest x-ray from 6/02 revealed low lung volumes, mild cardiac enlargement, no active disease. Dictated by:   Anselm Lis, N.P. Attending Physician:  Lyn Records. Iii DD:  09/07/01 TD:  09/07/01 Job: [id number]

## 2011-03-15 NOTE — Cardiovascular Report (Signed)
[name], [name] NO.:  [id number]   MEDICAL RECORD NO.:  [id number]          PATIENT TYPE:  OIB   LOCATION:  6501                         FACILITY:  [facility]   PHYSICIAN:  Lyn Records III, M.D.DATE OF BIRTH:  1926-03-21   DATE OF PROCEDURE:  02/04/2005  DATE OF DISCHARGE:                              CARDIAC CATHETERIZATION   INDICATION FOR THE STUDY:  The patient is to have knee replacement surgery  and rehabilitation.  A Cardiolite study was done to rule out high risk for  anesthesia and read and rehabilitation.  This study demonstrated a large  region of lateral wall and lateral apical ischemia done on a previous study  done several years ago.  This study is being done to rule out graft failure.   PROCEDURE PERFORMED:  1.  Left heart catheterization.  2.  Selective coronary angiography.  3.  Left ventriculography.  4.  Bypass graft angiography.  5.  Internal mammary graft angiography.   DESCRIPTION:  After informed consent a 4-French sheath was placed in the  right femoral artery using the modified Seldinger technique.  A 4-French A2  multipurpose catheter was used for hemodynamic recordings, left  ventriculography by hand injection, and selective right coronary angiography  and saphenous vein graft angiography.  An internal mammary catheter was used  for left internal mammary graft angiography and a 4-French #4 left Judkins  catheter was used left coronary angiography.  The patient tolerated  procedure without complications.   RESULTS:   I. HEMODYNAMIC DATA:  A.  Aortic pressure 153/74.  B.  Left ventricular pressure 152/15.   II. LEFT VENTRICULOGRAPHY:  The inferior wall is hypokinetic.  Overall left  ventricular function is normal.  EF is 60%.  No mitral regurgitation is  noted.   III. CORONARY ANGIOGRAPHY:  A.  Left main coronary:  Widely patent.  B.  Left anterior descending coronary:  Totaled in the mid vessel.  C.  Circumflex artery:   Totally occluded in the mid vessel beyond the origin  of the left atrial recurrent.  No significant obtuse marginal branches arise  from the native circumflex.  D.  Right coronary:  The right coronary artery is totally occluded beyond  the second acute marginal branch.  There is a segmental 90% stenosis in the  mid vessel before the first acute marginal branch.  The distal right  coronary fills by saphenous vein graft.   IV. SAPHENOUS VEIN GRAFT ANGIOGRAPHY:  A.  Saphenous vein graft sequential  to the first and third obtuse marginals:  Widely patent.  The third obtuse  marginal contains diffuse distal disease beyond the graft insertion site.  The fourth obtuse marginal which also arises just distal to the graft  insertion site contains 90% ostial narrowing.  The fourth obtuse marginal  was small.  The third obtuse marginal is large.  The first obtuse marginal  is widely patent via he graft.  B.  Saphenous vein graft to the right coronary:  This graft is widely patent  supplying an acute marginal branch in the distal right coronary.   V. LIMA  TO THE LAD:  This graft was never individually/selectively engaged.  It was well visualized, however.  It is widely patent. The mid LAD and  beyond are widely patent.   CONCLUSIONS:  1.  Patent saphenous vein grafts and left internal mammary graft as outlined      above.  2.  Overall normal left ventricular function with mid inferior wall      hypokinesis.  Ejection fraction greater than 6%.  3.  Severe native vessel coronary disease with total occlusion of the      proximal left anterior descending, the mid circumflex, and the mid right      coronary artery.   PLAN:  The patient is cleared for orthopedic surgery and the rehabilitation  to follow.  Management of coronary disease is most appropriate via medical  therapy.      HWS/MEDQ  D:  02/04/2005  T:  02/04/2005  Job:  [id number]   cc:   Chales Salmon. Abigail Miyamoto, M.D.  [address]   [place]  [place] [address]  Fax: [phone]   Kerrin Champagne, M.D.  [address]  [place]  [place] [address]  Fax: [phone]

## 2011-03-15 NOTE — Op Note (Signed)
[facility]  Patient:    [name], [name]                     MRN: [id number] Proc. Date: 05/02/00 Adm. Date:  [id number] Attending:  Lubertha South                           Operative Report  PREOPERATIVE DIAGNOSIS:  Left knee severe medial joint osteoarthritis and severe patellofemoral arthritis.  POSTOPERATIVE DIAGNOSIS:  Left knee severe medial joint osteoarthritis and severe patellofemoral arthritis.  OPERATION:  Left cemented total knee arthroplasty using Osteonics Scorpio cruciate-retaining components, #9 femoral component and #7 tibial component with a 10 mm insert and 26 mm patellar component.  SURGEON:  Kerrin Champagne, M.D.  ASSISTANT:  Illene Labrador. Aplington, M.D.  ANESTHESIA:  General orotracheal, Dr. Rica Mast.  ESTIMATED BLOOD LOSS:  150 cc.  DRAINS:  Hemovac x 1, Foley to straight drain.  BRIEF CLINICAL HISTORY:  The patient is a 77 year old female with a long history of increasing left knee pain and discomfort, difficulty with straightening of the knee and fully bending it with range of motion 5 degrees short full extension and flexion to 115 degrees, now experiencing night pain and is not relieved with anti-inflammatory agents, requiring narcotic medicines for pain relief.  She has undergone conservative measures in attempts of therapy, weight loss, use of steroid injections, anti-inflammatory agents, without benefit.  She is brought to the operating room with severe osteoarthritis changes involving the medial joint line, patellofemoral joint, requiring a total knee arthroplasty.  INTRAOPERATIVE FINDINGS:  The patient was found to have bone on bone medial compartment, osteoarthritis changes with erosion of the tibial plateau present.  A large osteophyte of the medial joint line as well as the lateral joint line.  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  DESCRIPTION OF OPERATION:  After adequate  general anesthesia, the left lower extremity was prepped from the ankle to the upper thigh with Dura-Prep solution and draped in the usual manner, iodine impregnated thigh drape and tourniquet about the left upper thigh.  Left leg was elevated, exsanguinated using Esmarch bandage and tourniquet inflated to 350 mmHg.  Incision, a standard medial parapatellar incision was made in the midline overlying approximately 15 cm in the mid quadriceps level and across the mid to medial aspect of the patella and then along the anteromedial aspect of the knee, anterior tibial tubercle.  Incision was carried through skin and subcutaneous layers down to the external retinacula of the knee.  The external retinaculum was then incised in line with skin incision and developed both medial and lateral to allow for closure at the end of the case.  Incision was then made into the quadriceps tendon, the medial one-third and then continued along the medial aspect of the tendon insertion into the patella, preserving cuff for later reattachment, and then preserving a cuff along the medial border of the patella and entering into the joint line sharply, incising the synovial lining of the joint and then extended into the medial fat pad down on the medial aspect of the left patella tendon and anterior tibial tubercle.  The fat pad retropatellar tendon area was then debrided.  The medial capsule elevated off the proximal tibia using a Cobb elevator and also elevating the medial collateral ligament, as the patient had severe varus deformity of her knee.  The medial meniscus was excised, its remnants, using electrocautery, dividing  it then within the joint line for lateral detachment in the posterior aspect of the meniscus.  Also, the anterior aspect of the lateral joint line was debrided at the meniscocapsular junction using subperiosteal dissection as well as Bovie electrocautery. Patella was then everted and the knee  placed into flexion.  Osteophytes along the lateral and medial aspects of the femoral condyle were excised as well as within the intercondylar notch. The anterior cruciate ligament was then divided and excised in total off both anterior tibial spine, as well as off of the posterolateral border of the lateral femoral condyle.  The knee flexed.  A step-cut reamer then used to perform initial entry into the femoral canal. Canal finder then inserted fully.  The intramedullary guide then set at 5 degrees valgus with the distal tibial block in place was then placed in the intramedullary canal, rotated to be parallel with the posterior aspect of the condyle of the femur.  This was attached to the distal femur using two pins.  The intramedullary guide portion was then removed and the block placed against the anterior aspect of the femur and a single oblique pin placed, further fixing the block.  Oscillating saw was then used to divide transversely the distal portion of the femur.  The distal femur was then sized and a size larger than #7.  A #9 component size fit.  With this then in place and adjusted for a #9 component using the feeler gauge, it was adjusted for a #9 and appropriate holes were placed over the distal end of the femur for placement of the cutting guide distally and cutting block.  The cutting block was then aligned with the distal cut and impacted into place.  Cutting guide was then used to cut the anterior and posterior chamfer cuts as well as the anterior posterior coronal cuts.  Note that the cuts were made first in the anterior coronal and posterior coronal, with care taken to preserve the posterior structures in the knee during resection of the posterior condyles. After cutting the chamfers, the cutting block was then removed.  Care was taken to inspect the posterior aspect of the posterior condyles and any excess bone was excised as well as osteophytes using a  curved osteotome and rongeurs.  The knee was then subluxed anteriorly and oscillating saw used to debride the tibial spines, flattening the surface of the proximal tibia.   The proximal portion of the tibia was then examined and the #7 plate appeared to give the best fit and was held in place.  A step-cut reamer was then used to perform reaming into the intramedullary canal of the proximal tibia.  The intramedullary guide was then used to place the cutting jig on the proximal tibia using a feeler gauge aligned with the medial joint line at 4 mm depth. Note that a feeler gauge was used to insure that a large amount of bone was not excised and only a minimal amount of medial tibial plateau was cut.  Alignment was used to properly orient the proximal cutting jib of the tibia and the guide was then fixed to the proximal tibia using two pins, placed anteriorly and then removing the intramedullary guide portion and fixing the pin against the anterior aspect of the tibia where a single cross pin was placed.  Oscillating saw was then used to divide proximal tibia at an angle 5 degrees posterior.  Following this, the posterior aspect of the medial and lateral meniscus were excised using  electrocautery.  The genicular artery is identified and cauterized both medial and lateral.  Care was taken to insure that no bony debris remained along the edge of the proximal tibia that was cut.  A trial #7 plate was then placed over the proximal portion of the tibia with a 10 mm insert and femoral component placed.  Note, prior to placing the femoral component, a final cutting jig, Scorpio retaining cruciate components placed in the distal femoral groove and then cut into place without difficulty.  This was the anterior femoral groove for the component.  The femoral component was then impacted onto place.  Tibial and femoral components both appear to show excellent alignment and position.  We flexed over  100 degrees without lift-off anterior of the tibial component.  Knee in full extension demonstrated no evidence of laxity, either medial or laterally and tended to full extension.  Next, the clamp was placed over the patella and the patella was then reamed to a department of 10 mm.  The drill guide was then placed and three drill holes placed in the posterior aspect of the patella recessed area.  Then, using the cutting guide, the superficial perimeter of the recessed area was carefully reamed.  A 26 mm guide was used and a 26 mm trial prosthesis placed.  Then, with the alignment guide in place, an alignment over the proximal tibial plate was performed, carefully bisecting the ankle joint distally.  Marks were then made of the anterior, medial and lateral aspect of the plate in line with the alignment guides provided.  All components were then removed and the proximal tibial then prepared using the flange osteotome in attaching the plate over the proximal portion of the tibia.  The plate was attached and aligned according to marks made using the previous alignment guide and then three pins used to fix the tower in place and the flange impacter used up to a 7-9 flange.  With this in, all of the jigs were removed, irrigation was then performed of the patella, the femur and the tibia, proximally removing any loose debris or bony debris.  Careful inspection demonstrated no bony debris present.  The permanent prosthesis, 26 mm patellar component, #9 femoral component, which was cruciate-retaining and a #7 tibial componet were then examined.  These components were then brought onto the field and cement mixed.  Following mixing of the cement, the knee was flexed and subluxed using the pitchfork type retractors.  Cement was then placed within the proximal femoral intramedullary canal, along the proximal cut surface.  This was then spread and permanent prosthesis then impacted into place  providing an excellent seal of cement and prosthesis and bone.  Excess cement was removed from the periphery.  A 10 mm trial spacer was then placed.  The distal portion of the femur was then coated with cement similarly, also cement was placed within the distal intramedullary canal.  The cement was also placed in the posterior rudders of the femoral component and component then impacted into place and excess cement removed form the periphery of of the perimeter of the femoral component.  The knee was brought into full extension. The patella was then filled with cement and the patellar component placed and squeezed into place.  The gripping clamp was used and this provided excellent compression across the component and the patella itself.  Excess cement was removed.  The knee was irrigated.  Two Hemovac drains were placed, exiting over the anterolateral aspect of the  left distal thigh.  When cement had completely hardened, the patella compressor was extracted and excess cement was removed using osteotome as well as rongeurs and scalpels.  Care was taken to insure that no excess cement remained around the femoral component.  Femoral osteophytes were resected laterally.  The knee was then placed through a range of motion and patellar component demonstrated no tendency to dislocation or subluxation laterally with flexion extension.  Extension was to 0 degrees and  flexion to 130 degrees.  Next, the knee was subluxed and the 10 mm trial insert for the proximal tibia was removed.  Inspection demonstrated no further cement over the posterior rudders or excess cement remaining.  The 10 mm permanent insert was then inserted and packed into place.  The knee was then placed in extension and full range of motion, full extension, full flexion to 125 degrees.  The patellar component then demonstrated no tendency to subluxation lateral with only thumb pressure over the anterior aspect.  Irrigation  was performed and then carefully, the patients hemovac drains were placed, both medial and lateral.  The synovial area was then closed with an interrupted suture of #1 Vicryl.  The tendinous portion of the quadriceps as well as the deep retinacula of the knee was reapproximated with interrupted #1 Vicryl sutures.  Care was taken, especially to the distal medial aspect of the  knee joint, fat pad area and soft tissues about the tibial tendon and anterior tibial tubercle.  This provided a clear water seal, tight closure.  Next, the superficial retinaculum was approximated with interrupted 2-0 Vicryl sutures.  Deep subcutaneous layers were approximated with interrupted 2-0 Vicryl sutures and the skin closed with a running subcuticular suture of 2-0 PDS.  Tincture of benzoin and Steri-Strips applied.  Adaptic, 4 x 4s, ABD pads were then fixed to the skin with as Kling and ace wrap applied from the toes to the upper thigh.  Tourniquet was released at 96 minutes.  Patient was then reactivated after placing the knee in knee immobilizer in full extension and returned to the recovery room in satisfactory condition. DD:  05/02/00 TD:  05/02/00 Job: [id number]

## 2011-03-15 NOTE — Discharge Summary (Signed)
[facility]  Patient:    [name], [name]                     MRN: [id number] Adm. Date:  [id number] Disc. Date: [id number] Attending:  Lubertha South Dictator:   Ralene Bathe, P.A. CC:         Chales Salmon. Abigail Miyamoto, M.D.                           Discharge Summary  ADMISSION DIAGNOSES: 1. End-stage osteoarthritis of left knee. 2. Coronary artery disease status post coronary artery bypass graft. 3. Hypertension. 4. Hypothyroidism.  DISCHARGE DIAGNOSES: 1. End-stage osteoarthritis of left knee. 2. Coronary artery disease status post coronary artery bypass graft. 3. Hypertension. 4. Hypothyroidism. 5. Status post total knee arthroplasty. 6. Postoperative hemorrhagic anemia with autologous transfusion.  CONSULTATIONS:  None.  PROCEDURES:  Left cemented total knee arthroplasty with Osteonics Scorpio cruciate retaining components #9 femoral, #7 tibial and 10 mm insert, 26 mm patella.  Under general anesthesia.  SURGEON:  Kerrin Champagne, M.D.  ASSISTANT:  Illene Labrador. Aplington, M.D.  BRIEF HISTORY:  This 77 year-old female with end-stage osteoarthritis to the left knee, refractory to conservative care.  She failed outpatient injections and nonsteroidals and at this time had bone on bone deformity and wished to proceed with total knee arthroplasty.  Risks and benefits were discussed with the patient and she was in agreement to proceed.  HOSPITAL COURSE:  The patient was admitted and underwent the above named procedure and tolerated this well.  All appropriate IV antibiotics and analgesics were provided.  Postoperatively she was placed on Coumadin for DVT and PE prophylaxis and placed in physical therapy weight bearing as tolerated, total knee replacement protocol.  All IV analgesics were weaned off by postoperative day two.  She did experience a postoperative hemorrhagic anemia that was transfused on postoperative day three with two units of  autologous blood without sequela.  She tolerated this well and felt much better the following day.  She began working with therapy postoperatively and reached all of her goals and by day May 06, 2000, postoperative day four, had a bowel movement.  She was tolerating p.o. and ambulating distances to meet goals with physical therapy.  Her knee was clean and dry with no drainage, no erythema. Hemoglobin was stable at 10.9.  She had had some mild elevated temperatures, however, was afebrile on last check prior to discharge.  There were no signs of infectious sources.  At this time she was voiding without difficulty.  She was felt stable for discharge to home.  LABORATORY DATA:  Protime followed by pharmacy on Coumadin.  Preoperative hemoglobin 11.5, postoperatively at 8.8 prior to transfusion, post-transfusion hemoglobin was 10.9.  Routine chemistries on admission showed an mildly elevated glucose at 164, postoperatively 182 otherwise normal chemistries. Urinalysis showed trace leukocyte esterase otherwise normal.  Blood type shows O positive, two units found from autologous transfusion in chart.  Radiology showed postoperative normal appearance of the left total knee replacement.  Preoperatively showed chest x-ray with mild cardiomegaly, no acute disease.  Preoperative knee films showed severe osteoarthritis, complete loss of medial joint space.  EKG not found at time of dictation.  CONDITION ON DISCHARGE:  Stable and improved.  DISPOSITION:  The patient is being discharged to home in the care of her family.  She is to follow up in two weeks postoperatively, call for a time.  DISCHARGE MEDICATIONS: 1. Oxy IR 5 mg 1 every 4-6 p.r.n. pain. 2. Robaxin 500 mg 1 every 8 p.r.n. spasm. 3. Coumadin per pharmacy protocol for DVT and PE prophylaxis.  This will be    monitored by home health R.N. with weekly pro times.  DISCHARGE INSTRUCTIONS:  Home health physical therapy is arranged prior  to discharge.  Daily dressing changes.  May shower at this time postoperatively. Resume home meds, home diet. DD:  05/06/00 TD:  05/06/00 Job: [id number]

## 2011-06-23 ENCOUNTER — Emergency Department (HOSPITAL_COMMUNITY): Payer: Medicare Other

## 2011-06-23 ENCOUNTER — Inpatient Hospital Stay (HOSPITAL_COMMUNITY)
Admission: EM | Admit: 2011-06-23 | Discharge: 2011-06-26 | DRG: 481 | Disposition: A | Discharge status: Rehabilitation Facility | Payer: Medicare Other | Attending: Orthopaedic Surgery | Admitting: Orthopaedic Surgery

## 2011-06-23 DIAGNOSIS — Z79899 Other long term (current) drug therapy: Secondary | ICD-10-CM

## 2011-06-23 DIAGNOSIS — Z23 Encounter for immunization: Secondary | ICD-10-CM

## 2011-06-23 DIAGNOSIS — E119 Type 2 diabetes mellitus without complications: Secondary | ICD-10-CM | POA: Diagnosis present

## 2011-06-23 DIAGNOSIS — E785 Hyperlipidemia, unspecified: Secondary | ICD-10-CM | POA: Diagnosis present

## 2011-06-23 DIAGNOSIS — Z951 Presence of aortocoronary bypass graft: Secondary | ICD-10-CM

## 2011-06-23 DIAGNOSIS — S7223XA Displaced subtrochanteric fracture of unspecified femur, initial encounter for closed fracture: Principal | ICD-10-CM | POA: Diagnosis present

## 2011-06-23 DIAGNOSIS — Y92009 Unspecified place in unspecified non-institutional (private) residence as the place of occurrence of the external cause: Secondary | ICD-10-CM

## 2011-06-23 DIAGNOSIS — I10 Essential (primary) hypertension: Secondary | ICD-10-CM | POA: Diagnosis present

## 2011-06-23 DIAGNOSIS — I517 Cardiomegaly: Secondary | ICD-10-CM | POA: Diagnosis present

## 2011-06-23 DIAGNOSIS — W010XXA Fall on same level from slipping, tripping and stumbling without subsequent striking against object, initial encounter: Secondary | ICD-10-CM | POA: Diagnosis present

## 2011-06-23 DIAGNOSIS — D62 Acute posthemorrhagic anemia: Secondary | ICD-10-CM | POA: Diagnosis not present

## 2011-06-23 DIAGNOSIS — I252 Old myocardial infarction: Secondary | ICD-10-CM

## 2011-06-23 DIAGNOSIS — Z96659 Presence of unspecified artificial knee joint: Secondary | ICD-10-CM

## 2011-06-23 DIAGNOSIS — N390 Urinary tract infection, site not specified: Secondary | ICD-10-CM | POA: Diagnosis present

## 2011-06-23 DIAGNOSIS — I2510 Atherosclerotic heart disease of native coronary artery without angina pectoris: Secondary | ICD-10-CM | POA: Diagnosis present

## 2011-06-23 LAB — CBC
HCT: 34.4 % — ABNORMAL LOW (ref 36.0–46.0)
Hemoglobin: 11.8 g/dL — ABNORMAL LOW (ref 12.0–15.0)
MCH: 31.3 pg (ref 26.0–34.0)
MCHC: 34.3 g/dL (ref 30.0–36.0)
MCV: 91.2 fL (ref 78.0–100.0)
Platelets: 200 10*3/uL (ref 150–400)
RBC: 3.77 MIL/uL — ABNORMAL LOW (ref 3.87–5.11)
RDW: 14 % (ref 11.5–15.5)
WBC: 11.6 10*3/uL — ABNORMAL HIGH (ref 4.0–10.5)

## 2011-06-23 LAB — POCT I-STAT, CHEM 8
BUN: 32 mg/dL — ABNORMAL HIGH (ref 6–23)
Calcium, Ion: 1.21 mmol/L (ref 1.12–1.32)
Chloride: 107 mEq/L (ref 96–112)
Creatinine, Ser: 1.3 mg/dL — ABNORMAL HIGH (ref 0.50–1.10)
Glucose, Bld: 142 mg/dL — ABNORMAL HIGH (ref 70–99)
HCT: 37 % (ref 36.0–46.0)
Hemoglobin: 12.6 g/dL (ref 12.0–15.0)
Potassium: 3.9 mEq/L (ref 3.5–5.1)
Sodium: 140 mEq/L (ref 135–145)
TCO2: 25 mmol/L (ref 0–100)

## 2011-06-23 LAB — URINE MICROSCOPIC-ADD ON

## 2011-06-23 LAB — URINALYSIS, ROUTINE W REFLEX MICROSCOPIC
Bilirubin Urine: NEGATIVE
Glucose, UA: NEGATIVE mg/dL
Hgb urine dipstick: NEGATIVE
Ketones, ur: NEGATIVE mg/dL
Nitrite: POSITIVE — AB
Protein, ur: NEGATIVE mg/dL
Specific Gravity, Urine: 1.011 (ref 1.005–1.030)
Urobilinogen, UA: 0.2 mg/dL (ref 0.0–1.0)
pH: 5 (ref 5.0–8.0)

## 2011-06-23 LAB — SAMPLE TO BLOOD BANK

## 2011-06-23 LAB — PROTIME-INR
INR: 1.07 (ref 0.00–1.49)
Prothrombin Time: 14.1 seconds (ref 11.6–15.2)

## 2011-06-24 ENCOUNTER — Emergency Department (HOSPITAL_COMMUNITY): Payer: Medicare Other

## 2011-06-24 LAB — CBC
HCT: 24.6 % — ABNORMAL LOW (ref 36.0–46.0)
Hemoglobin: 8.4 g/dL — ABNORMAL LOW (ref 12.0–15.0)
MCH: 31.1 pg (ref 26.0–34.0)
MCHC: 34.1 g/dL (ref 30.0–36.0)
MCV: 91.1 fL (ref 78.0–100.0)
Platelets: 165 10*3/uL (ref 150–400)
RBC: 2.7 MIL/uL — ABNORMAL LOW (ref 3.87–5.11)
RDW: 14.3 % (ref 11.5–15.5)
WBC: 12.1 10*3/uL — ABNORMAL HIGH (ref 4.0–10.5)

## 2011-06-24 LAB — GLUCOSE, CAPILLARY
Glucose-Capillary: 204 mg/dL — ABNORMAL HIGH (ref 70–99)
Glucose-Capillary: 139 mg/dL — ABNORMAL HIGH (ref 70–99)

## 2011-06-24 LAB — BASIC METABOLIC PANEL
BUN: 29 mg/dL — ABNORMAL HIGH (ref 6–23)
CO2: 25 mEq/L (ref 19–32)
Calcium: 8.3 mg/dL — ABNORMAL LOW (ref 8.4–10.5)
Chloride: 104 mEq/L (ref 96–112)
Creatinine, Ser: 1.05 mg/dL (ref 0.50–1.10)
GFR calc Af Amer: >60 mL/min (ref >60)
GFR calc non Af Amer: 50 mL/min — ABNORMAL LOW (ref >60)
Glucose, Bld: 208 mg/dL — ABNORMAL HIGH (ref 70–99)
Potassium: 4.1 mEq/L (ref 3.5–5.1)
Sodium: 137 mEq/L (ref 135–145)

## 2011-06-24 LAB — ABO/RH: ABO/RH(D): O POS

## 2011-06-25 DIAGNOSIS — E1165 Type 2 diabetes mellitus with hyperglycemia: Secondary | ICD-10-CM

## 2011-06-25 DIAGNOSIS — D62 Acute posthemorrhagic anemia: Secondary | ICD-10-CM

## 2011-06-25 DIAGNOSIS — S7223XA Displaced subtrochanteric fracture of unspecified femur, initial encounter for closed fracture: Secondary | ICD-10-CM

## 2011-06-25 DIAGNOSIS — IMO0001 Reserved for inherently not codable concepts without codable children: Secondary | ICD-10-CM

## 2011-06-25 DIAGNOSIS — I10 Essential (primary) hypertension: Secondary | ICD-10-CM

## 2011-06-25 DIAGNOSIS — S72009A Fracture of unspecified part of neck of unspecified femur, initial encounter for closed fracture: Secondary | ICD-10-CM

## 2011-06-25 LAB — URINALYSIS, ROUTINE W REFLEX MICROSCOPIC
Bilirubin Urine: NEGATIVE
Glucose, UA: NEGATIVE mg/dL
Ketones, ur: NEGATIVE mg/dL
Nitrite: NEGATIVE
Protein, ur: NEGATIVE mg/dL
Specific Gravity, Urine: 1.012 (ref 1.005–1.030)
Urobilinogen, UA: 0.2 mg/dL (ref 0.0–1.0)
pH: 5.5 (ref 5.0–8.0)

## 2011-06-25 LAB — GLUCOSE, CAPILLARY
Glucose-Capillary: 189 mg/dL — ABNORMAL HIGH (ref 70–99)
Glucose-Capillary: 149 mg/dL — ABNORMAL HIGH (ref 70–99)
Glucose-Capillary: 219 mg/dL — ABNORMAL HIGH (ref 70–99)
Glucose-Capillary: 186 mg/dL — ABNORMAL HIGH (ref 70–99)
Glucose-Capillary: 141 mg/dL — ABNORMAL HIGH (ref 70–99)

## 2011-06-25 LAB — CBC
HCT: 21.2 % — ABNORMAL LOW (ref 36.0–46.0)
Hemoglobin: 7.3 g/dL — ABNORMAL LOW (ref 12.0–15.0)
MCH: 31.5 pg (ref 26.0–34.0)
MCHC: 34.4 g/dL (ref 30.0–36.0)
MCV: 91.4 fL (ref 78.0–100.0)
Platelets: 141 10*3/uL — ABNORMAL LOW (ref 150–400)
RBC: 2.32 MIL/uL — ABNORMAL LOW (ref 3.87–5.11)
RDW: 14.7 % (ref 11.5–15.5)
WBC: 8.7 10*3/uL (ref 4.0–10.5)

## 2011-06-25 LAB — URINE CULTURE
Colony Count: >=100000
Culture  Setup Time: 201208270046

## 2011-06-25 LAB — URINE MICROSCOPIC-ADD ON

## 2011-06-26 ENCOUNTER — Inpatient Hospital Stay (HOSPITAL_COMMUNITY)
Admission: RE | Admit: 2011-06-26 | Discharge: 2011-07-05 | DRG: 945 | Disposition: A | Discharge status: Home Health | Payer: Medicare Other | Source: Ambulatory Visit | Attending: Physical Medicine & Rehabilitation | Admitting: Physical Medicine & Rehabilitation

## 2011-06-26 DIAGNOSIS — R339 Retention of urine, unspecified: Secondary | ICD-10-CM | POA: Diagnosis present

## 2011-06-26 DIAGNOSIS — S7223XA Displaced subtrochanteric fracture of unspecified femur, initial encounter for closed fracture: Secondary | ICD-10-CM | POA: Diagnosis present

## 2011-06-26 DIAGNOSIS — E785 Hyperlipidemia, unspecified: Secondary | ICD-10-CM | POA: Diagnosis present

## 2011-06-26 DIAGNOSIS — E1165 Type 2 diabetes mellitus with hyperglycemia: Secondary | ICD-10-CM

## 2011-06-26 DIAGNOSIS — E119 Type 2 diabetes mellitus without complications: Secondary | ICD-10-CM | POA: Diagnosis present

## 2011-06-26 DIAGNOSIS — D62 Acute posthemorrhagic anemia: Secondary | ICD-10-CM | POA: Diagnosis present

## 2011-06-26 DIAGNOSIS — Z7982 Long term (current) use of aspirin: Secondary | ICD-10-CM

## 2011-06-26 DIAGNOSIS — M545 Low back pain, unspecified: Secondary | ICD-10-CM | POA: Diagnosis present

## 2011-06-26 DIAGNOSIS — Y998 Other external cause status: Secondary | ICD-10-CM

## 2011-06-26 DIAGNOSIS — K219 Gastro-esophageal reflux disease without esophagitis: Secondary | ICD-10-CM | POA: Diagnosis not present

## 2011-06-26 DIAGNOSIS — I2510 Atherosclerotic heart disease of native coronary artery without angina pectoris: Secondary | ICD-10-CM | POA: Diagnosis present

## 2011-06-26 DIAGNOSIS — Z96659 Presence of unspecified artificial knee joint: Secondary | ICD-10-CM

## 2011-06-26 DIAGNOSIS — Z951 Presence of aortocoronary bypass graft: Secondary | ICD-10-CM

## 2011-06-26 DIAGNOSIS — Z5189 Encounter for other specified aftercare: Principal | ICD-10-CM

## 2011-06-26 DIAGNOSIS — IMO0001 Reserved for inherently not codable concepts without codable children: Secondary | ICD-10-CM

## 2011-06-26 DIAGNOSIS — I10 Essential (primary) hypertension: Secondary | ICD-10-CM

## 2011-06-26 DIAGNOSIS — K5900 Constipation, unspecified: Secondary | ICD-10-CM | POA: Diagnosis present

## 2011-06-26 DIAGNOSIS — W19XXXA Unspecified fall, initial encounter: Secondary | ICD-10-CM | POA: Diagnosis present

## 2011-06-26 LAB — CBC
HCT: 24.7 % — ABNORMAL LOW (ref 36.0–46.0)
Hemoglobin: 8.5 g/dL — ABNORMAL LOW (ref 12.0–15.0)
MCH: 31.4 pg (ref 26.0–34.0)
MCHC: 34.4 g/dL (ref 30.0–36.0)
MCV: 91.1 fL (ref 78.0–100.0)
Platelets: 138 10*3/uL — ABNORMAL LOW (ref 150–400)
RBC: 2.71 MIL/uL — ABNORMAL LOW (ref 3.87–5.11)
RDW: 14.8 % (ref 11.5–15.5)
WBC: 11 10*3/uL — ABNORMAL HIGH (ref 4.0–10.5)

## 2011-06-26 LAB — GLUCOSE, CAPILLARY
Glucose-Capillary: 193 mg/dL — ABNORMAL HIGH (ref 70–99)
Glucose-Capillary: 184 mg/dL — ABNORMAL HIGH (ref 70–99)
Glucose-Capillary: 191 mg/dL — ABNORMAL HIGH (ref 70–99)
Glucose-Capillary: 184 mg/dL — ABNORMAL HIGH (ref 70–99)

## 2011-06-26 LAB — BASIC METABOLIC PANEL
BUN: 25 mg/dL — ABNORMAL HIGH (ref 6–23)
CO2: 25 mEq/L (ref 19–32)
Calcium: 7.8 mg/dL — ABNORMAL LOW (ref 8.4–10.5)
Chloride: 98 mEq/L (ref 96–112)
Creatinine, Ser: 1.12 mg/dL — ABNORMAL HIGH (ref 0.50–1.10)
GFR calc Af Amer: 56 mL/min — ABNORMAL LOW (ref >60)
GFR calc non Af Amer: 47 mL/min — ABNORMAL LOW (ref >60)
Glucose, Bld: 203 mg/dL — ABNORMAL HIGH (ref 70–99)
Potassium: 3.9 mEq/L (ref 3.5–5.1)
Sodium: 133 mEq/L — ABNORMAL LOW (ref 135–145)

## 2011-06-27 DIAGNOSIS — M7989 Other specified soft tissue disorders: Secondary | ICD-10-CM

## 2011-06-27 LAB — COMPREHENSIVE METABOLIC PANEL
ALT: 21 U/L (ref 0–35)
AST: 44 U/L — ABNORMAL HIGH (ref 0–37)
Albumin: 2.4 g/dL — ABNORMAL LOW (ref 3.5–5.2)
Alkaline Phosphatase: 32 U/L — ABNORMAL LOW (ref 39–117)
BUN: 31 mg/dL — ABNORMAL HIGH (ref 6–23)
CO2: 29 mEq/L (ref 19–32)
Calcium: 8 mg/dL — ABNORMAL LOW (ref 8.4–10.5)
Chloride: 100 mEq/L (ref 96–112)
Creatinine, Ser: 1.05 mg/dL (ref 0.50–1.10)
GFR calc Af Amer: >60 mL/min (ref >60)
GFR calc non Af Amer: 50 mL/min — ABNORMAL LOW (ref >60)
Glucose, Bld: 159 mg/dL — ABNORMAL HIGH (ref 70–99)
Potassium: 4 mEq/L (ref 3.5–5.1)
Sodium: 135 mEq/L (ref 135–145)
Total Bilirubin: 0.6 mg/dL (ref 0.3–1.2)
Total Protein: 5.8 g/dL — ABNORMAL LOW (ref 6.0–8.3)

## 2011-06-27 LAB — CBC
HCT: 22.9 % — ABNORMAL LOW (ref 36.0–46.0)
Hemoglobin: 7.9 g/dL — ABNORMAL LOW (ref 12.0–15.0)
MCH: 31.2 pg (ref 26.0–34.0)
MCHC: 34.5 g/dL (ref 30.0–36.0)
MCV: 90.5 fL (ref 78.0–100.0)
Platelets: 161 10*3/uL (ref 150–400)
RBC: 2.53 MIL/uL — ABNORMAL LOW (ref 3.87–5.11)
RDW: 14.7 % (ref 11.5–15.5)
WBC: 11 10*3/uL — ABNORMAL HIGH (ref 4.0–10.5)

## 2011-06-27 LAB — CROSSMATCH
ABO/RH(D): O POS
Antibody Screen: NEGATIVE
Unit division: 0
Unit division: 0

## 2011-06-27 LAB — DIFFERENTIAL
Basophils Absolute: 0.1 10*3/uL (ref 0.0–0.1)
Basophils Relative: 1 % (ref 0–1)
Eosinophils Absolute: 0.1 10*3/uL (ref 0.0–0.7)
Eosinophils Relative: 1 % (ref 0–5)
Lymphocytes Relative: 18 % (ref 12–46)
Lymphs Abs: 2 10*3/uL (ref 0.7–4.0)
Monocytes Absolute: 1.4 10*3/uL — ABNORMAL HIGH (ref 0.1–1.0)
Monocytes Relative: 12 % (ref 3–12)
Neutro Abs: 7.5 10*3/uL (ref 1.7–7.7)
Neutrophils Relative %: 68 % (ref 43–77)

## 2011-06-27 LAB — URINALYSIS, MICROSCOPIC ONLY
Bilirubin Urine: NEGATIVE
Glucose, UA: NEGATIVE mg/dL
Hgb urine dipstick: NEGATIVE
Ketones, ur: NEGATIVE mg/dL
Nitrite: NEGATIVE
Protein, ur: NEGATIVE mg/dL
Specific Gravity, Urine: 1.02 (ref 1.005–1.030)
Urobilinogen, UA: 0.2 mg/dL (ref 0.0–1.0)
pH: 5 (ref 5.0–8.0)

## 2011-06-27 LAB — GLUCOSE, CAPILLARY
Glucose-Capillary: 170 mg/dL — ABNORMAL HIGH (ref 70–99)
Glucose-Capillary: 247 mg/dL — ABNORMAL HIGH (ref 70–99)
Glucose-Capillary: 257 mg/dL — ABNORMAL HIGH (ref 70–99)
Glucose-Capillary: 392 mg/dL — ABNORMAL HIGH (ref 70–99)

## 2011-06-28 DIAGNOSIS — D62 Acute posthemorrhagic anemia: Secondary | ICD-10-CM

## 2011-06-28 DIAGNOSIS — I10 Essential (primary) hypertension: Secondary | ICD-10-CM

## 2011-06-28 DIAGNOSIS — IMO0001 Reserved for inherently not codable concepts without codable children: Secondary | ICD-10-CM

## 2011-06-28 DIAGNOSIS — S7223XA Displaced subtrochanteric fracture of unspecified femur, initial encounter for closed fracture: Secondary | ICD-10-CM

## 2011-06-28 DIAGNOSIS — E1165 Type 2 diabetes mellitus with hyperglycemia: Secondary | ICD-10-CM

## 2011-06-28 LAB — CBC
HCT: 20.8 % — ABNORMAL LOW (ref 36.0–46.0)
Hemoglobin: 7.1 g/dL — ABNORMAL LOW (ref 12.0–15.0)
MCH: 31.6 pg (ref 26.0–34.0)
MCHC: 34.1 g/dL (ref 30.0–36.0)
MCV: 92.4 fL (ref 78.0–100.0)
Platelets: 193 10*3/uL (ref 150–400)
RBC: 2.25 MIL/uL — ABNORMAL LOW (ref 3.87–5.11)
RDW: 14.8 % (ref 11.5–15.5)
WBC: 10.3 10*3/uL (ref 4.0–10.5)

## 2011-06-28 LAB — URINE CULTURE
Colony Count: NO GROWTH
Culture  Setup Time: 201208301242
Culture: NO GROWTH

## 2011-06-28 LAB — GLUCOSE, CAPILLARY
Glucose-Capillary: 171 mg/dL — ABNORMAL HIGH (ref 70–99)
Glucose-Capillary: 213 mg/dL — ABNORMAL HIGH (ref 70–99)
Glucose-Capillary: 158 mg/dL — ABNORMAL HIGH (ref 70–99)
Glucose-Capillary: 126 mg/dL — ABNORMAL HIGH (ref 70–99)

## 2011-06-28 LAB — OCCULT BLOOD X 1 CARD TO LAB, STOOL
Fecal Occult Bld: NEGATIVE
Fecal Occult Bld: NEGATIVE

## 2011-06-29 DIAGNOSIS — I10 Essential (primary) hypertension: Secondary | ICD-10-CM

## 2011-06-29 DIAGNOSIS — D62 Acute posthemorrhagic anemia: Secondary | ICD-10-CM

## 2011-06-29 DIAGNOSIS — S7223XA Displaced subtrochanteric fracture of unspecified femur, initial encounter for closed fracture: Secondary | ICD-10-CM

## 2011-06-29 DIAGNOSIS — E1165 Type 2 diabetes mellitus with hyperglycemia: Secondary | ICD-10-CM

## 2011-06-29 DIAGNOSIS — IMO0001 Reserved for inherently not codable concepts without codable children: Secondary | ICD-10-CM

## 2011-06-29 LAB — CROSSMATCH
ABO/RH(D): O POS
Antibody Screen: NEGATIVE
Unit division: 0
Unit division: 0

## 2011-06-29 LAB — GLUCOSE, CAPILLARY
Glucose-Capillary: 127 mg/dL — ABNORMAL HIGH (ref 70–99)
Glucose-Capillary: 220 mg/dL — ABNORMAL HIGH (ref 70–99)
Glucose-Capillary: 144 mg/dL — ABNORMAL HIGH (ref 70–99)
Glucose-Capillary: 153 mg/dL — ABNORMAL HIGH (ref 70–99)

## 2011-06-30 LAB — GLUCOSE, CAPILLARY
Glucose-Capillary: 111 mg/dL — ABNORMAL HIGH (ref 70–99)
Glucose-Capillary: 144 mg/dL — ABNORMAL HIGH (ref 70–99)
Glucose-Capillary: 210 mg/dL — ABNORMAL HIGH (ref 70–99)
Glucose-Capillary: 178 mg/dL — ABNORMAL HIGH (ref 70–99)

## 2011-07-01 DIAGNOSIS — D62 Acute posthemorrhagic anemia: Secondary | ICD-10-CM

## 2011-07-01 DIAGNOSIS — IMO0001 Reserved for inherently not codable concepts without codable children: Secondary | ICD-10-CM

## 2011-07-01 DIAGNOSIS — S7223XA Displaced subtrochanteric fracture of unspecified femur, initial encounter for closed fracture: Secondary | ICD-10-CM

## 2011-07-01 DIAGNOSIS — E1165 Type 2 diabetes mellitus with hyperglycemia: Secondary | ICD-10-CM

## 2011-07-01 DIAGNOSIS — I10 Essential (primary) hypertension: Secondary | ICD-10-CM

## 2011-07-01 LAB — CBC
HCT: 29.3 % — ABNORMAL LOW (ref 36.0–46.0)
Hemoglobin: 9.9 g/dL — ABNORMAL LOW (ref 12.0–15.0)
MCH: 31.2 pg (ref 26.0–34.0)
MCHC: 33.8 g/dL (ref 30.0–36.0)
MCV: 92.4 fL (ref 78.0–100.0)
Platelets: 251 10*3/uL (ref 150–400)
RBC: 3.17 MIL/uL — ABNORMAL LOW (ref 3.87–5.11)
RDW: 15.4 % (ref 11.5–15.5)
WBC: 10.7 10*3/uL — ABNORMAL HIGH (ref 4.0–10.5)

## 2011-07-01 LAB — GLUCOSE, CAPILLARY
Glucose-Capillary: 124 mg/dL — ABNORMAL HIGH (ref 70–99)
Glucose-Capillary: 165 mg/dL — ABNORMAL HIGH (ref 70–99)
Glucose-Capillary: 110 mg/dL — ABNORMAL HIGH (ref 70–99)
Glucose-Capillary: 133 mg/dL — ABNORMAL HIGH (ref 70–99)

## 2011-07-02 DIAGNOSIS — D62 Acute posthemorrhagic anemia: Secondary | ICD-10-CM

## 2011-07-02 DIAGNOSIS — I10 Essential (primary) hypertension: Secondary | ICD-10-CM

## 2011-07-02 DIAGNOSIS — IMO0001 Reserved for inherently not codable concepts without codable children: Secondary | ICD-10-CM

## 2011-07-02 DIAGNOSIS — E1165 Type 2 diabetes mellitus with hyperglycemia: Secondary | ICD-10-CM

## 2011-07-02 DIAGNOSIS — S7223XA Displaced subtrochanteric fracture of unspecified femur, initial encounter for closed fracture: Secondary | ICD-10-CM

## 2011-07-02 LAB — GLUCOSE, CAPILLARY
Glucose-Capillary: 130 mg/dL — ABNORMAL HIGH (ref 70–99)
Glucose-Capillary: 154 mg/dL — ABNORMAL HIGH (ref 70–99)
Glucose-Capillary: 151 mg/dL — ABNORMAL HIGH (ref 70–99)
Glucose-Capillary: 95 mg/dL (ref 70–99)

## 2011-07-03 DIAGNOSIS — IMO0001 Reserved for inherently not codable concepts without codable children: Secondary | ICD-10-CM

## 2011-07-03 DIAGNOSIS — I10 Essential (primary) hypertension: Secondary | ICD-10-CM

## 2011-07-03 DIAGNOSIS — D62 Acute posthemorrhagic anemia: Secondary | ICD-10-CM

## 2011-07-03 DIAGNOSIS — S7223XA Displaced subtrochanteric fracture of unspecified femur, initial encounter for closed fracture: Secondary | ICD-10-CM

## 2011-07-03 DIAGNOSIS — E1165 Type 2 diabetes mellitus with hyperglycemia: Secondary | ICD-10-CM

## 2011-07-03 LAB — GLUCOSE, CAPILLARY
Glucose-Capillary: 111 mg/dL — ABNORMAL HIGH (ref 70–99)
Glucose-Capillary: 203 mg/dL — ABNORMAL HIGH (ref 70–99)
Glucose-Capillary: 86 mg/dL (ref 70–99)
Glucose-Capillary: 148 mg/dL — ABNORMAL HIGH (ref 70–99)

## 2011-07-04 LAB — GLUCOSE, CAPILLARY
Glucose-Capillary: 103 mg/dL — ABNORMAL HIGH (ref 70–99)
Glucose-Capillary: 153 mg/dL — ABNORMAL HIGH (ref 70–99)
Glucose-Capillary: 120 mg/dL — ABNORMAL HIGH (ref 70–99)
Glucose-Capillary: 147 mg/dL — ABNORMAL HIGH (ref 70–99)

## 2011-07-05 DIAGNOSIS — D62 Acute posthemorrhagic anemia: Secondary | ICD-10-CM

## 2011-07-05 DIAGNOSIS — IMO0001 Reserved for inherently not codable concepts without codable children: Secondary | ICD-10-CM

## 2011-07-05 DIAGNOSIS — I10 Essential (primary) hypertension: Secondary | ICD-10-CM

## 2011-07-05 DIAGNOSIS — S7223XA Displaced subtrochanteric fracture of unspecified femur, initial encounter for closed fracture: Secondary | ICD-10-CM

## 2011-07-05 DIAGNOSIS — E1165 Type 2 diabetes mellitus with hyperglycemia: Secondary | ICD-10-CM

## 2011-07-05 LAB — GLUCOSE, CAPILLARY: Glucose-Capillary: 125 mg/dL — ABNORMAL HIGH (ref 70–99)

## 2011-07-17 DIAGNOSIS — Z0271 Encounter for disability determination: Secondary | ICD-10-CM

## 2011-07-18 NOTE — Discharge Summary (Signed)
[name], [name] NO.:  [id number]  MEDICAL RECORD NO.:  [id number]  LOCATION:  4011                         FACILITY:  [facility]  PHYSICIAN:  [name], M.D.DATE OF BIRTH:  11/30/1925  DATE OF ADMISSION:  06/26/2011 DATE OF DISCHARGE:  07/05/2011                              DISCHARGE SUMMARY   DISCHARGE DIAGNOSES:  Right subtrochanteric femur fracture status post right femur reconstruction with nailing June 24, 2011.  Subcutaneous Lovenox for deep vein thrombosis prophylaxis, pain management, anemia, hypertension, non-insulin-dependent diabetes mellitus, hyperlipidemia, constipation resolved, and history of bilateral total knee replacement.  A 77 year old white female with history of bilateral total knee replacement admitted August 26 after a fall without loss of consciousness sustaining a right subtrochanteric fracture.  Underwent right femur reconstruction with nailing August 27 per Dr. Jerl Santos.  50% partial weightbearing.  Subcutaneous Lovenox added for deep vein thrombosis prophylaxis.  Postoperative anemia 7.3 and transfused with hemoglobin improved to 8.5 after 1 unit of packed red blood cells.  She was minimal assist for ambulation.  She was admitted for comprehensive rehab program.  PAST MEDICAL HISTORY:  See discharge diagnoses.  No alcohol or tobacco.  ALLERGIES:  None.  SOCIAL HISTORY:  Lives with 75 year old mentally challenged daughter. Son and a second daughter to assist as needed, 1-level home, 1 step to entry.  Functional history prior to admission was independent.  Functional status upon admission to rehab services was minimal assist bed mobility, minimal assist transfers, minimal assist to ambulate 20 feet with the rolling walker and cues for safety, minimum to max assist for lower body activities of daily living.  MEDICATIONS PRIOR TO ADMISSION: 1. Losartan 100 mg daily. 2. Hydrochlorothiazide 25 mg 1/2 tablet  daily. 3. Glipizide 5 mg daily. 4. Zocor 10 mg daily. 5. Aspirin 81 mg daily. 6. Vitamin B12 daily. 7. Folic acid daily.  PHYSICAL EXAMINATION:  VITAL SIGNS:  Blood pressure 106/67, pulse 94, temperature 97, respirations 18. GENERAL:  This was an alert female in no acute distress, oriented x3. EXTREMITIES:  Deep tendon reflexes 2+.  Her staples were still intact to the right hip.  Neurovascular sensation intact.  There was no drainage from the site. LUNGS:  Clear to auscultation. CARDIAC:  Regular rate and rhythm. ABDOMEN:  Soft, nontender.  Good bowel sounds.  REHABILITATION HOSPITAL COURSE:  The patient was admitted to Inpatient Rehab Services with therapies initiated on a 3-hour daily basis consisting of physical therapy, occupational therapy, and rehabilitation nursing. The following issues were addressed during the patient's rehabilitation stay.  Pertaining to [name]'s right subtrochanteric femur fracture, she had undergone reconstruction with nailing August 27 per Dr. Jerl Santos.  Staples had been removed.  No signs of infection. She was 50% partial weightbearing.  Subcutaneous Lovenox for deep vein thrombosis prophylaxis.  Venous Doppler studies were negative.  Pain management ongoing with the use of Robaxin and oxycodone with good results.  Blood pressures well-controlled with hydrochlorothiazide and Cozaar with no orthostatic changes.  Postoperative anemia with latest hemoglobin of 9.9.  The patient continued on iron supplement.  She had been transfused 2 units of packed red blood cells on June 28, 2011 for her anemia.  Her  blood sugars were well-controlled on glipizide and Glucophage.  Full diabetic teaching completed.  She remained on Zocor for hyperlipidemia.  She did have some mild constipation resolved with laxative assistance.  The patient received weekly collaborative interdisciplinary team conferences to discuss estimated length of stay, family teaching, and  any barriers to discharge.  She was supervision for bathing as well as upper body dressing, needing assistance for lower body dressing of minimum to moderate assist, steady assist for ambulation, minimal assist to navigate stairs.  She exhibited no unsafe behavior.  She was discharged to home after family teaching completed.  Latest labs showed hemoglobin 9.9, hematocrit 29.3, platelet 251,000. Chemistries with a sodium of 135, potassium 4.0, BUN 31, creatinine 1.05.  DISCHARGE MEDICATIONS AT TIME OF DICTATION: 1. Aspirin 81 mg daily. 2. Vitamin B12 1000 mcg daily. 3. Senokot tablets 2 at bedtime. 4. Folic acid 1 mg daily. 5. Hydrochlorothiazide 12.5 mg daily. 6. Cozaar 100 mg daily. 7. Zocor 10 mg daily. 8. Trinsicon 1 tablet twice daily. 9. Glipizide 5 mg twice daily. 10.Metformin 500 mg twice daily. 11.Pepcid 20 mg daily. 12.Robaxin 500 mg every 6 hours as needed for spasms, dispense of 60     tablets. 13.Oxycodone immediate release 5 mg 1-2 tablets every 4 hours for     severe pain, dispense of 60 tablets.  DIET:  Diabetic diet.  SPECIAL INSTRUCTIONS:  A 50% partial weightbearing.  The patient will continue with home health therapies as advised per [employer].  She would see Dr. Jerl Santos back in 2 weeks pertaining her recent femur fracture.  Follow up with Dr. Catha Gosselin, medical management 2 weeks.     Mariam Dollar, P.A.   ______________________________ [name], M.D.    DA/MEDQ  D:  07/04/2011  T:  07/04/2011  Job:  [id number]  cc:   [name], M.D. Anna Genre Little, M.D. Lubertha Basque Jerl Santos, M.D.  Electronically Signed by Mariam Dollar P.A. on 07/05/2011 12:36:43 PM Electronically Signed by Faith Rogue M.D. on 07/18/2011 09:58:18 AM

## 2011-07-18 NOTE — H&P (Signed)
[name], Brittney NO.:  [id number]  MEDICAL RECORD NO.:  [id number]  LOCATION:  4011                         FACILITY:  [facility]  PHYSICIAN:  [name], M.D.DATE OF BIRTH:  1926-02-29  DATE OF ADMISSION:  06/26/2011 DATE OF DISCHARGE:                             HISTORY & PHYSICAL   PRIMARY CARE PHYSICIAN:  Brittney Bee L. Little, MD  ORTHOPEDIC SURGEON:  Brittney Basque. Dalldorf, MD  CHIEF COMPLAINT:  Right hip pain after fall.  HISTORY OF PRESENT ILLNESS:  This is an 77 year old white female with history of bilateral knee replacements who was previously active admitted on August 26 after a fall without loss of consciousness.  She sustained a right subtrochanteric hip fracture.  She underwent right femur reconstruction nail on May 27 by Dr. Jerl Craig.  She is 50% weightbearing on the right leg.  She is placed on subcu Lovenox for DVT prophylaxis.  Postoperatively, she developed anemia and was transfused 1 unit from hemoglobin 7.3 to 8.5.  She has had problems with persistent cough and pain in the right lower extremity.  Rehab was asked to evaluate the patient.  I saw her yesterday and felt that she could benefit from an inpatient rehab stay.  REVIEW OF SYSTEMS:  Notable for the above.  She reports low back pain, persistent wet cough.  She has had very poor appetite and constipation. Full 12-point review is in the written H and P.  PAST MEDICAL HISTORY:  Positive for: 1. CAD with CABG. 2. Non-insulin-requiring diabetes. 3. Hypertension. 4. Hyperlipidemia. 5. Right total knee replacement in 2006. 6. Left total knee replacement in 2001. 7. Hysterectomy. 8. Appendectomy. 9. Hemorrhoidectomy.  She denies alcohol or tobacco.  FAMILY HISTORY:  Positive for CAD.  SOCIAL HISTORY:  The patient lives with her mentally challenged 38 year old daughter.  She has another son and second daughter who will assist with her and then the other family member.  They live  in one-level house with one step to enter.  ALLERGIES:  None.  HOME MEDICATIONS:  Losartan, HCTZ, glipizide, Zocor, aspirin, vitamin B12, and folic acid.  LABORATORY DATA:  Hemoglobin 8.5, white count 11,000.  Sodium 138, potassium 133, potassium 3.9, BUN 25, and creatinine 1.12.  CBGs have ranged from 141 to 193.  PHYSICAL EXAMINATION:  VITAL SIGNS:  Blood pressure is 106/67, pulse 94, respiratory rate 18, and temperature 97. GENERAL:  The patient generally pleasant, alert, no acute stress. HEENT:  Pupils equal, round, and reactive light.  Nose and throat exam notable for dry mucosa. NECK:  Supple without JVD or lymphadenopathy. CHEST:  Notable for few rhonchi, but no wheezes or rales.  Breathing was unlabored. HEART:  Regular rate and rhythm without murmur, rubs, or gallops. ABDOMEN:  Soft and nontender.  Bowel sounds are positive. SKIN:  Notable for right hip wounds.  Three of distally were intact with staples.  The proximal incision was notable for bloody discharge.  She had Allevyn dressing over each area.  Right leg was noticeable for swelling in the thigh and into the calf and was painful in posterior thigh and calf.  She had minimal tenderness in left lower extremity and no edema there. NEUROLOGIC:  Cranial nerves II through XII are grossly intact.  Reflexes 1+.  Sensation is grossly normal throughout.  Judgment, orientation, memory, and mood were all within normal limits.  Strength is 5/5 in upper extremities.  Right lower extremity, 1/5 at the hip, 2/5 at the knee, and 4/5 at the ankle.  Left lower extremity, she is 3/5 proximally and 5/5 distally.  POST ADMISSION PHYSICIAN EVALUATION: 1. Functional deficits secondary to right subtrochanteric hip fracture     status post right femur reconstruction nail, June 24, 2011. 2. The patient is admitted to receive collaborative interdisciplinary     care between physiatrist, rehab nursing staff, and therapy team. 3. The  patient's level of medical complexity and substantial therapy     needs in context so that medical necessity cannot be provided at a     lesser intensity of care.  Medical complexity in this case includes     the patient's DVT risk, history of CAD and CABG, insulin-requiring     diabetes, postoperative anemia, pain syndrome, and hypertension. 4. The patient has experienced substantial functional loss from her     baseline.  Premorbidly, she is independent.  Currently, she is min     assist bed mobility, min assist transfer, min assist gait 20 feet     rolling walker, min to max assist lower body ADLs.  Judging by the     patient's diagnosis, physical exam, and functional history, she has     a potential for functional progress which will result in measurable     gains while inpatient rehab.  These gains will be of substantial     and practical use upon discharge to home in facilitating mobility     and self-care. 5. The physiatrist will provide 24-hour management of medical needs as     well as oversight of therapy plan/treatment and provide guidance as     appropriate regarding interaction of two.  Medical problem list and     plan are below. 6. The 24-hour rehab nursing team will assist in management of the     patient's skin care needs as well as bowel and bladder function,     nutrition, integration of therapy, concepts, and techniques. 7. PT will assess and treat for lower extremity strength, range of     motion, functional mobility, gait, pain management, weightbearing     precautions with goals modified independent. 8. OT will assess and treat for upper extremity use ADLs, functional     ability, adherence to weightbearing precautions, pain management     with goals overall modified independent to set up. 9. Case management and social worker will assess and treat for     psychosocial issues and discharge planning. 10.Team conference will be held weekly to assess progress towards      goals and to determine barriers at discharge. 11.The patient has demonstrated sufficient medical stability and     exercise capacity to tolerate at least 3 hours of therapy per day     at least 5 days per week. 12.Estimated length of stay is approximately 7-10 days.  Prognosis is     good.  The patient is extremely motivated.  MEDICAL PROBLEM LIST AND PLAN: 1. Deep vein thrombosis prophylaxis with subcu Lovenox and aspirin.     Given her examination today, however, I am concerned about the risk     of DVT, so we will order lower extremity screening venous Dopplers.     Continue activity per  routine for now. 2. Pain management with p.r.n. oxycodone and Robaxin.  We will monitor     with increased activity.  May need to consider scheduled oxycodone     prior to activities based on her activity tolerance, although she     did show improvement in her activities today. 3. Postoperative anemia:  Add iron supplementation.  Follow up CBC in     the morning.  No active signs of significant bleeding other than     the wound which is only bleeding minimally at this point. 4. Blood pressure control:  HCTZ and Cozaar.  Blood pressure is     slightly borderline given her anemia may have caused some     symptomatology.  We will follow for tolerance of activities when up     with PT and OT. 5. Insulin-requiring diabetes.  This has been poorly controlled thus     far.  The patient is on glipizide and metformin.  We will consider     adjusting regimen going forward based on CBGs.  CBGs have been low     despite poor intake overall which is concerning. 6. Hyperlipidemia:  Zocor. 7. Poor p.o. intake.  We will ask dietitian recommendations.  Add     Ensure supplementation for calorie boost.  Encourage the patient to     increase her intake as tolerated. 8. Constipation:  Add Senokot-S scheduled.  Consider more aggressive     regimen in the future depending on her p.o. intake, but given the     fact  that she has not eaten much over the last few days, we will     pursue this aggressively at this point.     [name], M.D.     ZTS/MEDQ  D:  06/26/2011  T:  06/26/2011  Job:  [id number]  cc:   [name], M.D. Brittney Basque [name], M.D.  Electronically Signed by Faith Rogue M.D. on 07/18/2011 09:58:48 AM

## 2011-07-19 NOTE — Op Note (Signed)
NAMECASARA, PERRIER NO.:  [id number]  MEDICAL RECORD NO.:  [id number]  LOCATION:  [facility]                         FACILITY:  [facility]  PHYSICIAN:  Lubertha Basque. Zell Doucette, M.D.DATE OF BIRTH:  [birth year]  DATE OF PROCEDURE:  06/24/2011 DATE OF DISCHARGE:                              OPERATIVE REPORT   PREOPERATIVE DIAGNOSIS:  Right subtrochanteric fracture.  POSTOPERATIVE DIAGNOSIS:  Right subtrochanteric fracture.  PROCEDURE:  Right femur reconstruction nail.  ANESTHESIA:  General.  ATTENDING SURGEON:  Lubertha Basque. Jerl Santos, MD  ASSISTANT:  Lindwood Qua, PA   INDICATIONS FOR PROCEDURE:  The patient is an 76 year old woman who tripped at home and fell sustaining a fairly high energy injury.  She had a spiral subtrochanteric fracture which was a bit comminuted and significantly displaced.  She is offered ORIF in hopes of stabilizing her legs so she can sit up, potentially stand and walk once again. Informed operative consent was obtained after discussion of possible complications including reaction to anesthesia, infection, DVT, PE, death, and malunion.  SUMMARY FINDINGS AND PROCEDURE:  Under general anesthesia, right subtrochanteric fracture was exposed, reduced, stabilized.  We used Zimmer cable to stabilize the fracture and then placed a Smith and Nephew trochanteric antegrade nail using reconstruction fashion with 2 screws proximal and distal.  I used fluoroscopy throughout the case to make appropriate intraoperative decisions and read all these views myself.  Bryna Colander assisted throughout and was invaluable to the completion of the case, in that, he helped position and retract while I performed the procedure.  He maintained reduction while I placed the cable and rod and made this case possible.  DESCRIPTION OF PROCEDURE:  The patient was brought to the operating suite where general anesthetic was applied without difficulty.  She was placed in the  fracture table with some traction placed on the leg.  She was prepped and draped in normal sterile fashion.  After administration of preop IV cephalosporin, we tried to reduce her leg in a closed fashion and were unsuccessful.  I subsequently made an incision near the fracture site with dissection through IT band and vastus lateralis.  I reduced the fracture and stabilized it with a Zimmer cable.  We placed this on loosely at first.  I then placed through a different stab wound a guidewire at the tip of the greater trochanter down to the knees into the intraosseous on 2 views.  I reamed to 13 mm, and while Bryna Colander assisted with some reduction, we placed the TriGen femoral nail by Katrinka Blazing and Nephew.  This seated fully.  Then placed the 2 proximal screws and recon fashion up into the femoral head under fluoroscopic guidance. These were found to be central on two views.  We then placed two distal locking screws in a similar fashion freehand through to separate stab wounds.  We used fluoroscopy to examine the fracture site and a proximal distal portions of the nail AP and lateral.  These all showed good placement of hardware and reduction of the fracture.  I subsequently tightened down the cable at more the fracture site and cut the excess off.  We irrigated all the wounds with saline followed by reapproximation  of deep tissues including the IT band with 0 Vicryl and subcutaneous tissues with 2-0 undyed Vicryl.  We used staples at the multiple incision sites.  Adaptic was applied followed by dry gauze and tape.  Estimated blood loss was 500 mL and intraoperative fluids obtained from anesthesia records.  DISPOSITION:  The patient was extubated in operating room and taken to recovery in stable addition.  She was admitted to Orthopedic Surgery Service.  Appropriate postop care to include perioperative antibiotics and Lovenox and her baby aspirin for DVT prophylaxis.  We will also mobilize  her out of bed immediately partial weightbearing.     Lubertha Basque Jerl Santos, M.D.     PGD/MEDQ  D:  06/24/2011  T:  06/24/2011  Job:  [id number]  Electronically Signed by Marcene Corning M.D. on 07/19/2011 12:11:44 PM

## 2011-07-19 NOTE — Discharge Summary (Signed)
  [name], CRIADO NO.:  [id number]  MEDICAL RECORD NO.:  [id number]  LOCATION:  5039                         FACILITY:  [facility]  PHYSICIAN:  Lubertha Basque. Kamea Dacosta, M.D.DATE OF BIRTH:  June 28, 1926  DATE OF ADMISSION:  06/23/2011 DATE OF DISCHARGE:  06/26/2011                              DISCHARGE SUMMARY   ADDENDUM: With additional diagnoses of blood loss anemia for discharge diagnosis and then treatment was prior to her discharge to skilled nursing or a rehab unit with a hemoglobin of 7.3.  One unit of packed RBC was transfused to assure that the patient would continue to do well on recovery.  Also an additional medication that she has left off her room, med rec sheet was added usual dose of metformin.     Lindwood Qua, P.A.   ______________________________ Lubertha Basque. Jerl Santos, M.D.    MC/MEDQ  D:  06/25/2011  T:  06/25/2011  Job:  [id number]  Electronically Signed by Lindwood Qua P.A. on 06/27/2011 09:52:39 AM Electronically Signed by Marcene Corning M.D. on 07/19/2011 12:10:31 PM

## 2011-07-19 NOTE — Discharge Summary (Signed)
[name], [name] NO.:  [id number]  MEDICAL RECORD NO.:  [id number]  LOCATION:  5039                         FACILITY:  [facility]  PHYSICIAN:  [name], M.D.DATE OF BIRTH:  1926/03/19  DATE OF ADMISSION:  06/23/2011 DATE OF DISCHARGE:  06/26/2011                              DISCHARGE SUMMARY   ADMITTING DIAGNOSES: 1. Right hip proximal femur fracture. 2. History of myocardial infarction. 3. History of coronary artery bypass graft. 4. Hypertension. 5. Non-insulin-dependent diabetes mellitus. 6. Total knee replacement.  DISCHARGE DIAGNOSES: 1. Right hip proximal femur fracture. 2. History of myocardial infarction. 3. History of coronary artery bypass graft. 4. Hypertension. 5. Non-insulin-dependent diabetes mellitus. 6. Total knee replacement.  OPERATIONS:  Open reduction and internal fixation of right femur fracture.  BRIEF HISTORY:  [name] is a patient whom we met in the emergency room after a fall at her home when she kind of tripped on her shoe on a throw rug, was unable to stand and bear weight, was transported to the emergency room at [facility], at which time, on examination and x-ray findings revealed a proximal humerus fracture.  We discussed treatment options with the patient that being open reduction and internal fixation.  PERTINENT LABORATORY AND X-RAY FINDINGS:  Last hemoglobin of record was 8.4, but the CBC was also going to be drawn after this time, WBCs 12.1, hematocrit 24.6.  Sodium 137, potassium 4.1, BUN 29, creatinine 1.05, glucose 208.  X-rays of chest, mild cardiomegaly but no focal consolidation.  Hip x-ray and femur x-ray showed a spiral right proximal femur fracture noted.  COURSE IN THE HOSPITAL:  She was admitted from the emergency room and taken to the operating room for open reduction and internal fixation. Postoperatively, was put on the orthopedic floor 5000.  We had used perioperative antibiotics  and continued Ancef 1 gram q.8 h x3 doses, Lovenox for DVT prophylaxis per pharmacy, knee-high TEDs and then orthopedic p.r.n. orders for incentive spirometry and then activity which was partial weightbearing on the right side with help of physical therapy.  She was given appropriate pain medications, antiemetics, antispasmodics, and was monitored closely and did very well.  The first day postop, her vital signs were stable.  Blood pressure was little bit low.  We held her blood pressure medicines for the first day, but her lungs were clear.  Cardiac, regular rate and rhythm, S1 and S2. Abdomen, soft.  She was eating and voiding after catheter was removed and was working well with physical therapy on touchdown to partial weightbearing to her right lower extremity.  Due to the fact of her home situation where she cared for a special needs daughter 56 x 7, it was going to be necessary to place [name] into a skilled nursing facility short term that she can rehab herself and will be able to function with assistance and then be discharged home from the skilled nursing facility.  CONDITION ON DISCHARGE:  Improved.  FOLLOWUP:  She would be on partial weightbearing status, may be 20%-30% of her body weight on the right leg with walker most likely or crutches for balance is given.  She could be on a  regular diet as tolerated.  She will continue her home medicines as prescribed by the med management, discharge sheet, and monitor blood sugars as before, assuming it was before meals and at bedtime, and continue on her oral agent, may have her dressing changed, right hip.  Any sign of infection which would be increasing redness, drainage, increasing pain, call our office at [phone] for assistance and we would be happy to see her that day, otherwise, we would see her back in the office in 2 weeks for staple removal.  She can continue her knee-high TEDs for DVT prophylaxis.  We will stop her  Lovenox upon discharge from the hospital.  She is given a prescription for methocarbamol for spasm and one for Percocet or oxycodone for pain.     [name], P.A.   ______________________________ [name]. [name], M.D.    MC/MEDQ  D:  06/25/2011  T:  06/25/2011  Job:  [id number]  Electronically Signed by [name] P.A. on 06/27/2011 09:51:34 AM Electronically Signed by [name] M.D. on 07/19/2011 12:10:28 PM

## 2012-08-28 DIAGNOSIS — Z0000 Encounter for general adult medical examination without abnormal findings: Secondary | ICD-10-CM | POA: Insufficient documentation

## 2012-10-02 DIAGNOSIS — I25810 Atherosclerosis of coronary artery bypass graft(s) without angina pectoris: Secondary | ICD-10-CM | POA: Insufficient documentation

## 2012-10-23 DIAGNOSIS — Z9889 Other specified postprocedural states: Secondary | ICD-10-CM

## 2012-10-23 DIAGNOSIS — Z86718 Personal history of other venous thrombosis and embolism: Secondary | ICD-10-CM

## 2012-10-23 HISTORY — DX: Other specified postprocedural states: Z98.890

## 2012-10-23 HISTORY — DX: Personal history of other venous thrombosis and embolism: Z86.718

## 2012-11-30 DIAGNOSIS — IMO0001 Reserved for inherently not codable concepts without codable children: Secondary | ICD-10-CM | POA: Insufficient documentation

## 2013-05-13 DIAGNOSIS — R209 Unspecified disturbances of skin sensation: Secondary | ICD-10-CM | POA: Insufficient documentation

## 2013-08-17 DIAGNOSIS — E7800 Pure hypercholesterolemia, unspecified: Secondary | ICD-10-CM | POA: Insufficient documentation

## 2013-08-17 DIAGNOSIS — E039 Hypothyroidism, unspecified: Secondary | ICD-10-CM | POA: Insufficient documentation

## 2013-08-23 DIAGNOSIS — I459 Conduction disorder, unspecified: Secondary | ICD-10-CM

## 2013-08-23 HISTORY — DX: Conduction disorder, unspecified: I45.9

## 2013-09-18 ENCOUNTER — Encounter: Payer: Self-pay | Admitting: Interventional Cardiology

## 2013-09-18 ENCOUNTER — Encounter: Payer: Self-pay | Admitting: *Deleted

## 2013-09-20 ENCOUNTER — Ambulatory Visit: Payer: Medicare Other | Admitting: Interventional Cardiology

## 2013-09-21 ENCOUNTER — Ambulatory Visit (INDEPENDENT_AMBULATORY_CARE_PROVIDER_SITE_OTHER): Payer: Medicare Other | Admitting: Interventional Cardiology

## 2013-09-21 ENCOUNTER — Encounter: Payer: Self-pay | Admitting: Interventional Cardiology

## 2013-09-21 VITALS — BP 158/68 | HR 49 | Ht 62.0 in | Wt 164.0 lb

## 2013-09-21 DIAGNOSIS — I10 Essential (primary) hypertension: Secondary | ICD-10-CM

## 2013-09-21 DIAGNOSIS — I2510 Atherosclerotic heart disease of native coronary artery without angina pectoris: Secondary | ICD-10-CM

## 2013-09-21 DIAGNOSIS — I25709 Atherosclerosis of coronary artery bypass graft(s), unspecified, with unspecified angina pectoris: Secondary | ICD-10-CM | POA: Insufficient documentation

## 2013-09-21 DIAGNOSIS — R001 Bradycardia, unspecified: Secondary | ICD-10-CM | POA: Insufficient documentation

## 2013-09-21 DIAGNOSIS — I498 Other specified cardiac arrhythmias: Secondary | ICD-10-CM

## 2013-09-21 DIAGNOSIS — E785 Hyperlipidemia, unspecified: Secondary | ICD-10-CM | POA: Insufficient documentation

## 2013-09-21 HISTORY — DX: Essential (primary) hypertension: I10

## 2013-09-21 HISTORY — DX: Bradycardia, unspecified: R00.1

## 2013-09-21 HISTORY — DX: Atherosclerosis of coronary artery bypass graft(s), unspecified, with unspecified angina pectoris: I25.709

## 2013-09-21 MED ORDER — METOPROLOL TARTRATE 50 MG PO TABS: 50.0000 mg | ORAL_TABLET | Freq: Two times a day (BID) | ORAL | Status: DC

## 2013-09-21 NOTE — Progress Notes (Signed)
Patient ID: [name], female   DOB: Mar 17, 1926, 79 y.o.   MRN: [id number]    [address] [place], [place]  [address] Phone: [phone] Fax:  [phone]  Date:  09/21/2013   ID:  [name], DOB 30-Nov-1925, MRN [id number]  PCP:  Zoe Lan, NP  ASSESSMENT:  1. Sinus bradycardia, on beta blocker therapy 2. Asymptomatic coronary artery disease. The patient is status post CABG in 1993 3. Hypertension, currently under adequate control 4. Hyperlipidemia on therapy  PLAN:  1. Decrease metoprolol to 50 mg twice a day 2. Resume amlodipine 2.5 mg daily 3. Followup with Zoe Lan, NP, and if heart rate is still less than 55 I would recommend further decreasing the metoprolol to 25 mg twice a day 4. Cardiology followup if symptoms or persistent bradycardia off beta blocker therapy   SUBJECTIVE: [name] is a 79 y.o. female who is asymptomatic and referred by Zoe Lan because of slow heart rate. The patient is on metoprolol 75 mg twice a day. She denies orthopnea, PND, chest pain, edema, and fainting. She has mild dyspnea on exertion. She feels this is age related. She is still very independent, drives herself, and takes care of a daughter who is mentally challenged. The daughter is 48 years old per   Wt Readings from Last 3 Encounters:  09/21/13 164 lb (74.39 kg)     Past Medical History  Diagnosis Date  . Hypercholesteremia   . HTN (hypertension)   . Hyperlipidemia   . Hypothyroidism   . Osteoarthritis   . Hip fracture   . Diabetes   . Osteopenia   . Hypothyroidism     Current Outpatient Prescriptions  Medication Sig Dispense Refill  . amLODipine (NORVASC) 2.5 MG tablet Take 2.5 mg by mouth daily.      Marland Kitchen aspirin 81 MG tablet Take 81 mg by mouth daily.      . calcium carbonate 1250 MG capsule Take 1,250 mg by mouth daily.      . Cholecalciferol (VITAMIN D-3) 1000 UNITS CAPS Take 1 capsule by mouth daily.      Marland Kitchen CINNAMON PO Take 1  tablet by mouth daily.      . Cyanocobalamin (VITAMIN B 12 PO) Take 1,000 mg by mouth daily.      . Flaxseed, Linseed, (FLAX SEED OIL) 1000 MG CAPS Take 1 capsule by mouth daily.      . folic acid (FOLVITE) 400 MCG tablet Take 400 mcg by mouth daily.      Marland Kitchen glipiZIDE (GLUCOTROL) 5 MG tablet Take 5 mg by mouth daily before breakfast.      . hydrochlorothiazide (HYDRODIURIL) 25 MG tablet Take 25 mg by mouth daily.      Marland Kitchen levothyroxine (SYNTHROID, LEVOTHROID) 100 MCG tablet Take 100 mcg by mouth daily before breakfast.      . losartan (COZAAR) 100 MG tablet Take 100 mg by mouth daily.      . Melatonin 3 MG TABS Take 1 tablet by mouth at bedtime.      . metFORMIN (GLUCOPHAGE) 500 MG tablet Take 500 mg by mouth daily.      . metoprolol tartrate (LOPRESSOR) 25 MG tablet Take 25 mg by mouth as directed. 1 1/2 tablet 2 times daily      . Multiple Vitamins-Minerals (CENTRUM SILVER PO) Take 1 tablet by mouth daily.      Marland Kitchen omega-3 acid ethyl esters (LOVAZA) 1 G capsule Take 450 mg  by mouth daily.      . Omega-3 Fatty Acids (FISH OIL TRIPLE STRENGTH) 1400 MG CAPS Take 1 tablet by mouth daily.      Marland Kitchen pyridOXINE (VITAMIN B-6) 100 MG tablet Take 100 mg by mouth daily.       No current facility-administered medications for this visit.    Allergies:   No Known Allergies  Social History:   History   Social History  . Marital Status: Widowed    Spouse Name: N/A    Number of Children: N/A  . Years of Education: N/A   Occupational History  . Not on file.   Social History Main Topics  . Smoking status: Never Smoker   . Smokeless tobacco: Not on file  . Alcohol Use: No  . Drug Use: No  . Sexual Activity: Not on file   Other Topics Concern  . Not on file   Social History Narrative  . No narrative on file    ROS:  Please see the history of present illness.   No complaints other than arthritis and limited mobility after left hip fracture.   Appetite is stable. No weight loss. Mental status is  stable, she denies forgetfulness/confusion. All other systems reviewed and negative.   OBJECTIVE: VS:  BP 158/68  Pulse 49  Ht 5\' 2"  (1.575 m)  Wt 164 lb (74.39 kg)  BMI 29.99 kg/m2 Well nourished, well developed, in no acute distress, elderly but younger than stated age HEENT: normal Neck: JVD flat. Carotid bruit absent  Cardiac:  normal S1, S2; RRR; 2/6 systolic murmur left upper sternal border and right upper sternal border murmur Lungs:  clear to auscultation bilaterally, no wheezing, rhonchi or rales Abd: soft, nontender, no hepatomegaly Ext: Edema absent. Pulses 2+ and symmetric Skin: warm and dry Neuro:  CNs 2-12 intact, no focal abnormalities noted  EKG:  Sinus bradycardia at 48 beats per minute left atrial abnormality, and nonspecific T wave flattening.       Signed, Darci Needle III, MD 09/21/2013 3:21 PM

## 2013-09-21 NOTE — Patient Instructions (Signed)
Reduce Metoprolol to 50mg  twice daily  Your physician recommends that you schedule a follow-up appointment in: Jan/2015 with your Primary Care Doctor

## 2013-11-16 DIAGNOSIS — Z68.41 Body mass index (BMI) pediatric, 5th percentile to less than 85th percentile for age: Secondary | ICD-10-CM | POA: Insufficient documentation

## 2014-02-14 DIAGNOSIS — Z789 Other specified health status: Secondary | ICD-10-CM | POA: Insufficient documentation

## 2014-02-14 DIAGNOSIS — H9190 Unspecified hearing loss, unspecified ear: Secondary | ICD-10-CM | POA: Insufficient documentation

## 2014-02-15 DIAGNOSIS — E119 Type 2 diabetes mellitus without complications: Secondary | ICD-10-CM | POA: Insufficient documentation

## 2014-02-15 DIAGNOSIS — I10 Essential (primary) hypertension: Secondary | ICD-10-CM | POA: Insufficient documentation

## 2014-02-15 HISTORY — DX: Type 2 diabetes mellitus without complications: E11.9

## 2014-05-06 ENCOUNTER — Encounter: Payer: Self-pay | Admitting: *Deleted

## 2014-09-30 ENCOUNTER — Telehealth: Payer: Self-pay | Admitting: Interventional Cardiology

## 2014-09-30 DIAGNOSIS — R001 Bradycardia, unspecified: Secondary | ICD-10-CM

## 2014-09-30 NOTE — Telephone Encounter (Signed)
New Msg   Boyd Kerbsenny from [facility] calling to discuss info about pt. Please contact Boyd Kerbsenny at [phone], she states to ask for them to get her to the phone.

## 2014-10-03 NOTE — Telephone Encounter (Signed)
Message viewed after hours will, will return call first thing tomorrow morning.

## 2014-10-03 NOTE — Telephone Encounter (Signed)
Follow up     Talk to the nurse before patient's appt on 10-04-14.  She said it it important and please call prior to tues appt

## 2014-10-04 ENCOUNTER — Ambulatory Visit (INDEPENDENT_AMBULATORY_CARE_PROVIDER_SITE_OTHER): Payer: Medicare Other | Admitting: Interventional Cardiology

## 2014-10-04 ENCOUNTER — Encounter: Payer: Self-pay | Admitting: Interventional Cardiology

## 2014-10-04 VITALS — BP 148/78 | HR 91 | Ht 62.0 in | Wt 149.0 lb

## 2014-10-04 DIAGNOSIS — I10 Essential (primary) hypertension: Secondary | ICD-10-CM

## 2014-10-04 DIAGNOSIS — R001 Bradycardia, unspecified: Secondary | ICD-10-CM

## 2014-10-04 DIAGNOSIS — I25709 Atherosclerosis of coronary artery bypass graft(s), unspecified, with unspecified angina pectoris: Secondary | ICD-10-CM

## 2014-10-04 DIAGNOSIS — E785 Hyperlipidemia, unspecified: Secondary | ICD-10-CM

## 2014-10-04 MED ORDER — METOPROLOL TARTRATE 25 MG PO TABS: 25.0000 mg | ORAL_TABLET | Freq: Two times a day (BID) | ORAL | Status: DC

## 2014-10-04 NOTE — Telephone Encounter (Signed)
returned call to Pennie,NP @ [facility]. pt was having continued slow heart rhythm.she thoght pt should be seen by Dr.Smith. adv her that pt has a appt today @ 4:15.pt is asymptomatic.adv her that Dr.Smith reviewed pt EKG that she faxed over, and it was unchanged compared to pt prior EKG.she has reduced pt Metoprolol to 25mg  bid. Pt med list updated

## 2014-10-04 NOTE — Progress Notes (Signed)
Patient ID: Brittney GlassmanLottie M Craig, female   DOB: [birth year], 80 y.o.   MRN: [id number]    [address] [place], [place]  [address] Phone: [phone]) [phone] Fax:  [phone]  Date:  10/04/2014   ID:  [name], DOB [birth year], MRN [id number]  PCP:  No primary care provider on file.   ASSESSMENT:  1. Tachycardia bradycardia noted on recent EKG and by auscultation. Rule out the tachybradycardia syndrome with possible component of atrial fibrillation   2. Coronary artery disease 3. Essential hypertension with good control  PLAN:  1. 48 hour Holter monitor   SUBJECTIVE: [name] is a 80 y.o. female is asymptomatic but her primary physician referred her because of irregular heart rhythm. Recent EKG demonstrated sinus bradycardia with pauses up to 2 seconds. The EKG seems to demonstrate blocked PACs. The patient denies chest discomfort. She has not had syncope.   Wt Readings from Last 3 Encounters:  10/04/14 149 lb (67.586 kg)  09/21/13 164 lb (74.39 kg)     Past Medical History  Diagnosis Date  . Hypercholesteremia   . HTN (hypertension)   . Hyperlipidemia   . Hypothyroidism   . Osteoarthritis   . Hip fracture   . Diabetes   . Osteopenia   . Hypothyroidism     Current Outpatient Prescriptions  Medication Sig Dispense Refill  . amLODipine (NORVASC) 2.5 MG tablet Take 2.5 mg by mouth daily.    Marland Kitchen. aspirin 81 MG tablet Take 81 mg by mouth daily.    . calcium carbonate 1250 MG capsule Take 1,250 mg by mouth daily.    . Cholecalciferol (VITAMIN D-3) 1000 UNITS CAPS Take 1 capsule by mouth daily.    Marland Kitchen. CINNAMON PO Take 1 tablet by mouth daily.    . Cyanocobalamin (VITAMIN B 12 PO) Take 1,000 mg by mouth daily.    . Flaxseed, Linseed, (FLAX SEED OIL) 1000 MG CAPS Take 1 capsule by mouth daily.    . folic acid (FOLVITE) 400 MCG tablet Take 400 mcg by mouth daily.    Marland Kitchen. glipiZIDE (GLUCOTROL) 5 MG tablet Take 5 mg by mouth daily before breakfast.    .  hydrochlorothiazide (HYDRODIURIL) 25 MG tablet Take 25 mg by mouth daily.    Marland Kitchen. levothyroxine (SYNTHROID, LEVOTHROID) 100 MCG tablet Take 100 mcg by mouth daily before breakfast.    . losartan (COZAAR) 100 MG tablet Take 100 mg by mouth daily.    . metoprolol tartrate (LOPRESSOR) 25 MG tablet Take 1 tablet (25 mg total) by mouth 2 (two) times daily.    . Multiple Vitamins-Minerals (CENTRUM SILVER PO) Take 1 tablet by mouth daily.    Marland Kitchen. omega-3 acid ethyl esters (LOVAZA) 1 G capsule Take 450 mg by mouth daily.    Marland Kitchen. pyridOXINE (VITAMIN B-6) 100 MG tablet Take 100 mg by mouth daily.    . Omega-3 Fatty Acids (FISH OIL TRIPLE STRENGTH) 1400 MG CAPS Take 1 tablet by mouth daily.     No current facility-administered medications for this visit.    Allergies:    Allergies  Allergen Reactions  . Ace Inhibitors     Other reaction(s): Other (See Comments) cough    Social History:  The patient  reports that she has never smoked. She does not have any smokeless tobacco history on file. She reports that she does not drink alcohol or use illicit drugs.   ROS:  Please see the history of present illness.   Elderly with  good bowel habits. No difficulty sleeping. No edema.   All other systems reviewed and negative.   OBJECTIVE: VS:  BP 148/78 mmHg  Pulse 91  Ht 5\' 2"  (1.575 m)  Wt 149 lb (67.586 kg)  BMI 27.25 kg/m2  SpO2 99% Well nourished, well developed, in no acute distress, elderly and frail HEENT: normal Neck: JVD flat. Carotid bruit absent  Cardiac:  normal S1, S2; IRR; no murmur Lungs:  clear to auscultation bilaterally, no wheezing, rhonchi or rales Abd: soft, nontender, no hepatomegaly Ext: Edema absent. Pulses 2+ Skin: warm and dry Neuro:  CNs 2-12 intact, no focal abnormalities noted  EKG:  Sinus bradycardia, blocked PACs, pauses, up to 2 seconds.       Signed, Darci NeedleHenry W. B. Demetruis Depaul III, MD 10/04/2014 5:47 PM

## 2014-10-04 NOTE — Patient Instructions (Signed)
Your physician recommends that you continue on your current medications as directed. Please refer to the Current Medication list given to you today.  Your physician has recommended that you wear a holter monitor. Holter monitors are medical devices that record the heart's electrical activity. Doctors most often use these monitors to diagnose arrhythmias. Arrhythmias are problems with the speed or rhythm of the heartbeat. The monitor is a small, portable device. You can wear one while you do your normal daily activities. This is usually used to diagnose what is causing palpitations/syncope (passing out).  Your physician wants you to follow-up in: 1 year with Dr.Smith You will receive a reminder letter in the mail two months in advance. If you don't receive a letter, please call our office to schedule the follow-up appointment.

## 2014-10-05 ENCOUNTER — Encounter: Payer: Self-pay | Admitting: *Deleted

## 2014-10-05 ENCOUNTER — Encounter (INDEPENDENT_AMBULATORY_CARE_PROVIDER_SITE_OTHER): Payer: Medicare Other

## 2014-10-05 DIAGNOSIS — R001 Bradycardia, unspecified: Secondary | ICD-10-CM

## 2014-10-05 NOTE — Progress Notes (Signed)
Patient ID: Brittney GlassmanLottie M Heideman, female   DOB: 20-Nov-1925, 80 y.o.   MRN: [id number] [facility] 48 hour holter monitor applied to patient.

## 2014-10-10 ENCOUNTER — Other Ambulatory Visit: Payer: Self-pay

## 2014-10-10 DIAGNOSIS — R001 Bradycardia, unspecified: Secondary | ICD-10-CM

## 2014-10-10 MED ORDER — METOPROLOL TARTRATE 25 MG PO TABS: 25.0000 mg | ORAL_TABLET | Freq: Two times a day (BID) | ORAL | Status: DC

## 2014-11-01 ENCOUNTER — Telehealth: Payer: Self-pay | Admitting: Interventional Cardiology

## 2014-11-01 NOTE — Telephone Encounter (Signed)
returned pt call. lmtcb 

## 2014-11-01 NOTE — Telephone Encounter (Signed)
New Msg         Pt calling to get results of Holter monitor. Please call.

## 2014-11-02 NOTE — Telephone Encounter (Signed)
F/U          Pt returning call, would like to get monitor results.

## 2014-11-04 NOTE — Telephone Encounter (Signed)
Called to give pt holter results and Dr.Smith's recommendation.lmtcb 

## 2014-11-04 NOTE — Telephone Encounter (Signed)
Follow up ° ° ° ° °Returning a nurses call to get monitor results °

## 2014-11-04 NOTE — Telephone Encounter (Signed)
Returning Lisa's call.  Advised her to stop her Metoprolol.  She would also like Dr. Katrinka BlazingSmith to refer her to a new PCP.  She currently sees a K.Jones,PA at [facility]. (doesn't know who the physician is in that practice).  She just is not comfortable with her.  Advised will have Misty StanleyLisa call her back Mon.

## 2014-11-11 ENCOUNTER — Telehealth: Payer: Self-pay | Admitting: Interventional Cardiology

## 2014-11-11 DIAGNOSIS — E119 Type 2 diabetes mellitus without complications: Secondary | ICD-10-CM

## 2014-11-11 NOTE — Telephone Encounter (Signed)
New Msg           Pt returning call, states she has made a decision in regards to which dr she would prefer.    Please call pt.

## 2014-11-11 NOTE — Telephone Encounter (Signed)
Follow up     Patient want to change MD that was mention by Dr. Katrinka BlazingSmith .[facility]ants [facility] . Would like a call back today

## 2014-11-11 NOTE — Telephone Encounter (Signed)
Pt sts that she is unhappy with her pcp office [facility], she is currently being seen by a PA and is not comfortable with her. Adv her that I think Dr.Smith would be ok with ref her to another pcp practice, asked her if she has a preference, she will speak to her daughter since she will be the one bringing her to her appts and call back. She asked that I go over her holter monitor results again. Adv her that Dr.Smith wants her to STOP Metoprolol, her monitor results did show that her heartrate was to slow at times, and stopping her beta blocker would help with that. She verbalized understanding. She will call back once she talks with her daughter about choosing another pcp practice

## 2014-11-14 NOTE — Telephone Encounter (Signed)
F/U         Pt daughter Clint LippsLea now calling and would like call back.

## 2014-11-14 NOTE — Telephone Encounter (Signed)
Follow Up  Pt calling to notify to Rn about which PCP she would prefer- [facility]. Pt requested to speak w/ Rn about how to proceed. Please call back and discuss.

## 2014-11-14 NOTE — Telephone Encounter (Signed)
Returned pt daughter call. Pt would like a ref for a pcp. She would like to be ref to [facility], she has requested Dr.Little but would be happy yo see any one in that practice.

## 2014-12-02 ENCOUNTER — Ambulatory Visit: Payer: Medicare Other | Admitting: Interventional Cardiology

## 2014-12-03 ENCOUNTER — Ambulatory Visit (INDEPENDENT_AMBULATORY_CARE_PROVIDER_SITE_OTHER): Payer: Medicare Other

## 2014-12-03 ENCOUNTER — Ambulatory Visit (INDEPENDENT_AMBULATORY_CARE_PROVIDER_SITE_OTHER): Payer: Medicare Other | Admitting: Family Medicine

## 2014-12-03 VITALS — BP 132/74 | HR 66 | Temp 98.0°F | Resp 18 | Ht 61.0 in | Wt 149.0 lb

## 2014-12-03 DIAGNOSIS — M129 Arthropathy, unspecified: Secondary | ICD-10-CM | POA: Diagnosis not present

## 2014-12-03 DIAGNOSIS — M25512 Pain in left shoulder: Secondary | ICD-10-CM | POA: Diagnosis not present

## 2014-12-03 DIAGNOSIS — S43015A Anterior dislocation of left humerus, initial encounter: Secondary | ICD-10-CM | POA: Diagnosis not present

## 2014-12-03 DIAGNOSIS — M19012 Primary osteoarthritis, left shoulder: Secondary | ICD-10-CM

## 2014-12-03 NOTE — Patient Instructions (Addendum)
Follow-up with orthopedist.   Apply ice for about 15 minutes several times daily for the next day or 2.  Tylenol if needed for pain  Avoid excessive activity with that shoulder until you follow up with the orthopedic doctor.   Wear a sling for the next few days to rest the arm.  Return at anytime if problems occur.

## 2014-12-03 NOTE — Progress Notes (Signed)
Subjective: 81 year old lady who tripped over the base of the base of the stove at home. She fell injuring her left shoulder. The patient wondered whether she had knocked it out of socket. It is very painful. It happened earlier today.  Dr. Otelia SergeantNitka is her regular orthopedic physician and replaced most of her knees.  Objective: The shoulder looks grossly deformed, just below the joint. It is very tender. Neurovascular intact. Fully alert and oriented  Assessment: Left shoulder pain with proximal humerus fracture versus dislocation Diabetes, no history of kidney disease  Remote history of CABG  Plan: Aleve 220 mg orally with some crackers X-ray shoulder  [facility] reading (PRIMARY) by  Dr.Candace Begue Dislocation left humerus, anterior. Old arthritic changes with a couple of probably old fragments visible.  Will have stat over read done before attempting to reduce the dislocation.  Procedure note: Closed reduction of shoulder dislocation with intraarticular anesthesia Shoulder was anesthetized using 3 mL of 2% lidocaine intra-articular injection. Sterile technique, a lateral approach. Patient tolerated well. Reduction was initially attempted with patient seated in the wheelchair. This was not successful. The patient was put on the exam table on her stomach and are left arm was allowed to dangle straight down. Reduction was attempted with direct traction Stimpson technique.  This was unsuccessful and 2 L of IV fluids was strep to her left arm. She was left for about 15 minutes in this position, and it did not reduce. Further traction was then again applied and she suddenly exclaimed that is gone back in. Seems to be in place with good motion motion.  [facility] reading (PRIMARY) by  Dr. Alwyn RenHopper Postreduction view normal. Degenerative arthritic changes are again noted.  See instructions. Asked the patient to follow-up with her orthopedist..

## 2015-01-12 ENCOUNTER — Ambulatory Visit: Payer: Self-pay | Admitting: Physical Therapy

## 2015-01-13 ENCOUNTER — Ambulatory Visit: Payer: PRIVATE HEALTH INSURANCE | Admitting: Internal Medicine

## 2015-01-30 ENCOUNTER — Ambulatory Visit: Payer: Medicare Other | Admitting: Physical Therapy

## 2015-12-19 ENCOUNTER — Encounter: Payer: Self-pay | Admitting: Interventional Cardiology

## 2015-12-19 ENCOUNTER — Ambulatory Visit (INDEPENDENT_AMBULATORY_CARE_PROVIDER_SITE_OTHER): Payer: Medicare Other | Admitting: Interventional Cardiology

## 2015-12-19 VITALS — BP 170/80 | HR 79 | Ht 61.0 in | Wt 141.4 lb

## 2015-12-19 DIAGNOSIS — I10 Essential (primary) hypertension: Secondary | ICD-10-CM

## 2015-12-19 DIAGNOSIS — I25709 Atherosclerosis of coronary artery bypass graft(s), unspecified, with unspecified angina pectoris: Secondary | ICD-10-CM | POA: Diagnosis not present

## 2015-12-19 DIAGNOSIS — E1159 Type 2 diabetes mellitus with other circulatory complications: Secondary | ICD-10-CM

## 2015-12-19 NOTE — Patient Instructions (Signed)
Medication Instructions:  Your physician recommends that you continue on your current medications as directed. Please refer to the Current Medication list given to you today.   Labwork: None ordered  Testing/Procedures: None ordered  Follow-Up: Your physician wants you to follow-up in: 1 year you will receive a reminder letter in the mail two months in advance. If you don't receive a letter, please call our office to schedule the follow-up appointment.   Any Other Special Instructions Will Be Listed Below (If Applicable). Please reduce salt in your diet     If you need a refill on your cardiac medications before your next appointment, please call your pharmacy.

## 2015-12-19 NOTE — Progress Notes (Signed)
Cardiology Office Note   Date:  12/19/2015   ID:  [name], DOB 1926-10-10, MRN [id number]  PCP:  Irving Copas, MD  Cardiologist:  Lesleigh Noe, MD   Chief Complaint  Patient presents with  . Coronary Artery Disease      History of Present Illness: [name] is a 82 y.o. female who presents for  Follow-up with coronary artery disease, hypertension, hyperlipidemia, and diabetes mellitus.   She has no angina. She lives independently. She takes care of her daughter who is handicapped. She provides a lot of personal hygiene care. She still drives all vehicle. She exercises by walking redundantly within her house. She has not had orthopnea, PND, or syncope.    Past Medical History  Diagnosis Date  . Hypercholesteremia   . HTN (hypertension)   . Hyperlipidemia   . Hypothyroidism   . Osteoarthritis   . Hip fracture (HCC)   . Diabetes (HCC)   . Osteopenia   . Hypothyroidism     Past Surgical History  Procedure Laterality Date  . Coronary artery bypass graft      with LIMA to LAD,sequential SVG to OM # 1 & 3, SVG to PDA. All grafts patent with normal LV function 2006, followed by cardiologist Dr. Katrinka Blazing  . Appendectomy    . Hemorrhoid surgery       Current Outpatient Prescriptions  Medication Sig Dispense Refill  . amLODipine (NORVASC) 2.5 MG tablet Take 2.5 mg by mouth daily.    Marland Kitchen aspirin 81 MG tablet Take 81 mg by mouth daily.    . calcium carbonate 1250 MG capsule Take 1,250 mg by mouth daily.    . Cholecalciferol (VITAMIN D-3) 1000 UNITS CAPS Take 1 capsule by mouth daily.    Marland Kitchen CINNAMON PO Take 1 tablet by mouth daily.    . Cyanocobalamin (VITAMIN B 12 PO) Take 1,000 mg by mouth daily.    . Flaxseed, Linseed, (FLAX SEED OIL) 1000 MG CAPS Take 1 capsule by mouth daily.    . folic acid (FOLVITE) 400 MCG tablet Take 400 mcg by mouth daily.    . hydrochlorothiazide (HYDRODIURIL) 25 MG tablet Take 25 mg by mouth daily.    Marland Kitchen levothyroxine  (SYNTHROID, LEVOTHROID) 100 MCG tablet Take 100 mcg by mouth daily before breakfast.    . losartan (COZAAR) 100 MG tablet Take 100 mg by mouth daily.    . Multiple Vitamins-Minerals (CENTRUM SILVER PO) Take 1 tablet by mouth daily.    Marland Kitchen omega-3 acid ethyl esters (LOVAZA) 1 G capsule Take 450 mg by mouth daily.    Marland Kitchen pyridOXINE (VITAMIN B-6) 100 MG tablet Take 100 mg by mouth daily.    . simvastatin (ZOCOR) 10 MG tablet Take 10 mg by mouth daily.  0   No current facility-administered medications for this visit.    Allergies:   Ace inhibitors    Social History:  The patient  reports that she has never smoked. She has never used smokeless tobacco. She reports that she does not drink alcohol or use illicit drugs.   Family History:  The patient's family history includes Alzheimer's disease in her brother; Heart disease in her mother.    ROS:  Please see the history of present illness.   Otherwise, review of systems are positive for  Weight gain, arthritis , but otherwise stable..   All other systems are reviewed and negative.    PHYSICAL EXAM: VS:  BP 170/80 mmHg  Pulse 79  Ht  (1.549 m)  Wt 141 lb 6.4 oz (64.139 kg)  BMI 26.73 kg/m2 , BMI Body mass index is 26.73 kg/(m^2). GEN: Well nourished, well developed, in no acute distress HEENT: normal Neck: no JVD, carotid bruits, or masses Cardiac: RRR.  There is no murmur, rub, or gallop. There is no edema. Respiratory:  clear to auscultation bilaterally, normal work of breathing. GI: soft, nontender, nondistended, + BS MS: no deformity or atrophy Skin: warm and dry, no rash Neuro:  Strength and sensation are intact Psych: euthymic mood, full affect   EKG:  EKG is ordered today. The ekg reveals  Normal sinus rhythm , nonspecific T wave flattening , otherwise unremarkable   Recent Labs: No results found for requested labs within last 365 days.    Lipid Panel No results found for: CHOL, TRIG, HDL, CHOLHDL, VLDL, LDLCALC,  LDLDIRECT    Wt Readings from Last 3 Encounters:  12/19/15 141 lb 6.4 oz (64.139 kg)  12/03/14 149 lb (67.586 kg)  10/04/14 149 lb (67.586 kg)      Other studies Reviewed: Additional studies/ records that were reviewed today include:  Reviewed last electronic chart entries.. The findings include  There is what entry concerning her left shoulder injury that occurred when she fell at home. No recurrence..    ASSESSMENT AND PLAN:  1. Coronary artery disease involving coronary bypass graft of native heart with unspecified angina pectoris  asymptomatic without angina  2. Essential hypertension, benign  elevated systolic blood pressure  3. Controlled type 2 diabetes mellitus with other circulatory complication (HCC)  followed by primary care, Dr. Henrine Screws    Current medicines are reviewed at length with the patient today.  The patient has the following concerns regarding medicines:  none.  The following changes/actions have been instituted:     low-salt diet   monitor blood pressure at home. Blood pressure parameters are less than 150/90 mmHg. She will call if consistently elevated above these  Labs/ tests ordered today include:    No orders of the defined types were placed in this encounter.     Disposition:   FU with HS in 1 year  Signed, Lesleigh Noe, MD  12/19/2015 4:30 PM    [facility] [address] [place], [place], [place]  [address] Phone: [phone]; Fax: [phone]

## 2016-10-01 ENCOUNTER — Ambulatory Visit: Payer: Medicare Other | Admitting: Physician Assistant

## 2016-10-01 NOTE — Progress Notes (Unsigned)
Cardiology Office Note    Date:  10/01/2016   ID:  Brittney GlassmanLottie M Craig, DOB November 01, 1925, MRN [id number]  PCP:  Brittney CopasHACKER,ROBERT KELLER, MD  Cardiologist:  Dr. Katrinka BlazingSmith  Chief Complaint: ***  History of Present Illness:   [name] is a 82 y.o. female with hx of CAD s/p CABG 1993, HTN, HLD, DM and hypothyroidism who presented for  The patient was doing well on cardiac stand point when last seen by Dr. Katrinka BlazingSmith 12/19/15.   Past Medical History:  Diagnosis Date  . Diabetes (HCC)   . Hip fracture (HCC)   . HTN (hypertension)   . Hypercholesteremia   . Hyperlipidemia   . Hypothyroidism   . Hypothyroidism   . Osteoarthritis   . Osteopenia     Past Surgical History:  Procedure Laterality Date  . APPENDECTOMY    . CORONARY ARTERY BYPASS GRAFT     with LIMA to LAD,sequential SVG to OM # 1 & 3, SVG to PDA. All grafts patent with normal LV function 2006, followed by cardiologist Dr. Katrinka BlazingSmith  . HEMORRHOID SURGERY      Current Medications: Prior to Admission medications   Medication Sig Start Date End Date Taking? Authorizing Provider  amLODipine (NORVASC) 2.5 MG tablet Take 2.5 mg by mouth daily.    Historical Provider, MD  aspirin 81 MG tablet Take 81 mg by mouth daily.    Historical Provider, MD  calcium carbonate 1250 MG capsule Take 1,250 mg by mouth daily.    Historical Provider, MD  Cholecalciferol (VITAMIN D-3) 1000 UNITS CAPS Take 1 capsule by mouth daily.    Historical Provider, MD  CINNAMON PO Take 1 tablet by mouth daily.    Historical Provider, MD  Cyanocobalamin (VITAMIN B 12 PO) Take 1,000 mg by mouth daily.    Historical Provider, MD  Flaxseed, Linseed, (FLAX SEED OIL) 1000 MG CAPS Take 1 capsule by mouth daily.    Historical Provider, MD  folic acid (FOLVITE) 400 MCG tablet Take 400 mcg by mouth daily.    Historical Provider, MD  hydrochlorothiazide (HYDRODIURIL) 25 MG tablet Take 25 mg by mouth daily.    Historical Provider, MD  levothyroxine (SYNTHROID, LEVOTHROID)  100 MCG tablet Take 100 mcg by mouth daily before breakfast.    Historical Provider, MD  losartan (COZAAR) 100 MG tablet Take 100 mg by mouth daily.    Historical Provider, MD  Multiple Vitamins-Minerals (CENTRUM SILVER PO) Take 1 tablet by mouth daily.    Historical Provider, MD  omega-3 acid ethyl esters (LOVAZA) 1 G capsule Take 450 mg by mouth daily.    Historical Provider, MD  pyridOXINE (VITAMIN B-6) 100 MG tablet Take 100 mg by mouth daily.    Historical Provider, MD  simvastatin (ZOCOR) 10 MG tablet Take 10 mg by mouth daily. 09/11/15   Historical Provider, MD    Allergies:   Ace inhibitors   Social History   Social History  . Marital status: Widowed    Spouse name: N/A  . Number of children: N/A  . Years of education: N/A   Social History Main Topics  . Smoking status: Never Smoker  . Smokeless tobacco: Never Used  . Alcohol use No  . Drug use: No  . Sexual activity: Not on file   Other Topics Concern  . Not on file   Social History Narrative  . No narrative on file     Family History:  The patient's family history includes Alzheimer's disease in her brother;  Heart disease in her mother. ***  ROS:   Please see the history of present illness.    ROS All other systems reviewed and are negative.   PHYSICAL EXAM:   VS:  There were no vitals taken for this visit.   GEN: Well nourished, well developed, in no acute distress  HEENT: normal  Neck: no JVD, carotid bruits, or masses Cardiac: ***RRR; no murmurs, rubs, or gallops,no edema  Respiratory:  clear to auscultation bilaterally, normal work of breathing GI: soft, nontender, nondistended, + BS MS: no deformity or atrophy  Skin: warm and dry, no rash Neuro:  Alert and Oriented x 3, Strength and sensation are intact Psych: euthymic mood, full affect  Wt Readings from Last 3 Encounters:  12/19/15 141 lb 6.4 oz (64.1 kg)  12/03/14 149 lb (67.6 kg)  10/04/14 149 lb (67.6 kg)      Studies/Labs Reviewed:    EKG:  EKG is ordered today.  The ekg ordered today demonstrates ***  Recent Labs: No results found for requested labs within last 8760 hours.   Lipid Panel No results found for: CHOL, TRIG, HDL, CHOLHDL, VLDL, LDLCALC, LDLDIRECT  Additional studies/ records that were reviewed today include:   Echocardiogram:  Cardiac Catheterization:     ASSESSMENT & PLAN:    1. ***    Medication Adjustments/Labs and Tests Ordered: Current medicines are reviewed at length with the patient today.  Concerns regarding medicines are outlined above.  Medication changes, Labs and Tests ordered today are listed in the Patient Instructions below. There are no Patient Instructions on file for this visit.   Lorelei PontSigned, Coury Grieger, PA  10/01/2016 1:20 PM    [facility] [address], [place], [place]  [address] Phone: [phone]; Fax: [phone]

## 2016-10-16 ENCOUNTER — Ambulatory Visit (INDEPENDENT_AMBULATORY_CARE_PROVIDER_SITE_OTHER): Payer: Medicare Other | Admitting: Podiatry

## 2016-10-16 ENCOUNTER — Encounter: Payer: Self-pay | Admitting: Podiatry

## 2016-10-16 DIAGNOSIS — M79672 Pain in left foot: Secondary | ICD-10-CM | POA: Diagnosis not present

## 2016-10-16 DIAGNOSIS — M79671 Pain in right foot: Secondary | ICD-10-CM | POA: Diagnosis not present

## 2016-10-16 DIAGNOSIS — Q828 Other specified congenital malformations of skin: Secondary | ICD-10-CM | POA: Diagnosis not present

## 2016-10-16 DIAGNOSIS — L851 Acquired keratosis [keratoderma] palmaris et plantaris: Secondary | ICD-10-CM

## 2016-10-16 DIAGNOSIS — L84 Corns and callosities: Secondary | ICD-10-CM

## 2016-10-16 DIAGNOSIS — B351 Tinea unguium: Secondary | ICD-10-CM | POA: Diagnosis not present

## 2016-10-16 DIAGNOSIS — E0843 Diabetes mellitus due to underlying condition with diabetic autonomic (poly)neuropathy: Secondary | ICD-10-CM | POA: Diagnosis not present

## 2016-10-16 DIAGNOSIS — L608 Other nail disorders: Secondary | ICD-10-CM | POA: Diagnosis not present

## 2016-10-16 DIAGNOSIS — M79609 Pain in unspecified limb: Secondary | ICD-10-CM | POA: Diagnosis not present

## 2016-10-16 DIAGNOSIS — L603 Nail dystrophy: Secondary | ICD-10-CM | POA: Diagnosis not present

## 2016-10-16 NOTE — Progress Notes (Signed)
SUBJECTIVE Patient with a history of diabetes mellitus presents to office today complaining of elongated, thickened nails. Pain while ambulating in shoes. Patient is unable to trim their own nails.   Allergies  Allergen Reactions  . Ace Inhibitors     Other reaction(s): Other (See Comments) cough    OBJECTIVE General Patient is awake, alert, and oriented x 3 and in no acute distress. Derm Hyperkeratotic corn lesion noted to the plantar aspect of the third digit left foot. Painful on palpation. There is a central hyperkeratotic core noted within the skin lesion.  Skin is dry and supple bilateral. Negative open lesions or macerations. Remaining integument unremarkable. Nails are tender, long, thickened and dystrophic with subungual debris, consistent with onychomycosis, 1-5 bilateral. No signs of infection noted. Vasc  DP and PT pedal pulses palpable bilaterally. Temperature gradient within normal limits.  Neuro Epicritic and protective threshold sensation diminished bilaterally.  Musculoskeletal Exam No symptomatic pedal deformities noted bilateral. Muscular strength within normal limits.  ASSESSMENT 1. Diabetes Mellitus w/ peripheral neuropathy 2. Onychomycosis of nail due to dermatophyte bilateral 3. Pain in foot bilateral 4. Painful porokeratosis third digit left foot  PLAN OF CARE 1. Patient evaluated today. 2. Instructed to maintain good pedal hygiene and foot care. Stressed importance of controlling blood sugar.  3. Mechanical debridement of nails 1-5 bilaterally performed using a nail nipper. Filed with dremel without incident.  4. Excisional debridement of porokeratosis lesion was performed using a chisel blade without incident or be bleeding.  5. Return to clinic in 3 mos.     Felecia ShellingBrent M. Zanyia Silbaugh, DPM [facility]  Dr. Felecia ShellingBrent M. Selwyn Reason, DPM   [address]                                        [place], [place] [address]                Office [phone]    Fax [phone]

## 2016-12-16 ENCOUNTER — Ambulatory Visit (INDEPENDENT_AMBULATORY_CARE_PROVIDER_SITE_OTHER): Payer: Medicare Other | Admitting: Podiatry

## 2016-12-16 DIAGNOSIS — L608 Other nail disorders: Secondary | ICD-10-CM

## 2016-12-16 DIAGNOSIS — L603 Nail dystrophy: Secondary | ICD-10-CM | POA: Diagnosis not present

## 2016-12-16 DIAGNOSIS — M79609 Pain in unspecified limb: Secondary | ICD-10-CM

## 2016-12-16 DIAGNOSIS — B351 Tinea unguium: Secondary | ICD-10-CM | POA: Diagnosis not present

## 2016-12-16 DIAGNOSIS — E0843 Diabetes mellitus due to underlying condition with diabetic autonomic (poly)neuropathy: Secondary | ICD-10-CM

## 2016-12-24 NOTE — Progress Notes (Signed)
   SUBJECTIVE Patient with a history of diabetes mellitus presents to office today complaining of elongated, thickened nails. Pain while ambulating in shoes. Patient is unable to trim their own nails.   OBJECTIVE General Patient is awake, alert, and oriented x 3 and in no acute distress. Derm Skin is dry and supple bilateral. Negative open lesions or macerations. Remaining integument unremarkable. Nails are tender, long, thickened and dystrophic with subungual debris, consistent with onychomycosis, 1-5 bilateral. No signs of infection noted. Vasc  DP and PT pedal pulses palpable bilaterally. Temperature gradient within normal limits.  Neuro Epicritic and protective threshold sensation diminished bilaterally.  Musculoskeletal Exam pain on palpation noted to the fourth MPJ left foot with hammertoe contracture deformity. No symptomatic pedal deformities noted bilateral. Muscular strength within normal limits.  ASSESSMENT 1. Diabetes Mellitus w/ peripheral neuropathy 2. Onychomycosis of nail due to dermatophyte bilateral 3. Pain in foot bilateral 4. Hammertoe fourth digit left foot  PLAN OF CARE 1. Patient evaluated today. 2. Instructed to maintain good pedal hygiene and foot care. Stressed importance of controlling blood sugar.  3. Mechanical debridement of nails 1-5 bilaterally performed using a nail nipper. Filed with dremel without incident.  4. Today were going to recommend padding to offload the fourth metatarsal phalangeal joint 5. Prescription for anti-plantar pain cream through [facility] 6. Return to clinic in 3 months    Felecia ShellingBrent M. Sora Vrooman, DPM [facility]  Dr. Felecia ShellingBrent M. Iyana Topor, DPM    [address]                                        [place], [place] [address]                Office [phone]  Fax [phone]

## 2017-01-01 ENCOUNTER — Telehealth: Payer: Self-pay | Admitting: *Deleted

## 2017-01-01 NOTE — Telephone Encounter (Signed)
Pt called to ask if she was going to get a cream from Dr. Logan BoresEvans. Pt's dtr, Lea called to state pt is hearing impaired and is suppose to get a cream from [facility]. Left message informing pt's dtr, I was sending rx as a [facility] and left [facility] [phone] and explained they would call with insurance and delivery instructions, I also asked that she call with pharmacy name pt uses if we had to call in a medication in the future.

## 2017-01-06 ENCOUNTER — Ambulatory Visit: Payer: Medicare Other | Admitting: Interventional Cardiology

## 2017-01-28 ENCOUNTER — Encounter: Payer: Self-pay | Admitting: Cardiology

## 2017-01-28 ENCOUNTER — Encounter (INDEPENDENT_AMBULATORY_CARE_PROVIDER_SITE_OTHER): Payer: Self-pay

## 2017-01-28 ENCOUNTER — Ambulatory Visit (INDEPENDENT_AMBULATORY_CARE_PROVIDER_SITE_OTHER): Payer: Medicare Other | Admitting: Cardiology

## 2017-01-28 ENCOUNTER — Telehealth: Payer: Self-pay | Admitting: *Deleted

## 2017-01-28 VITALS — BP 140/60 | HR 116 | Ht 62.0 in | Wt 132.8 lb

## 2017-01-28 DIAGNOSIS — E782 Mixed hyperlipidemia: Secondary | ICD-10-CM

## 2017-01-28 DIAGNOSIS — I10 Essential (primary) hypertension: Secondary | ICD-10-CM

## 2017-01-28 DIAGNOSIS — I4892 Unspecified atrial flutter: Secondary | ICD-10-CM | POA: Diagnosis not present

## 2017-01-28 DIAGNOSIS — I2510 Atherosclerotic heart disease of native coronary artery without angina pectoris: Secondary | ICD-10-CM

## 2017-01-28 MED ORDER — APIXABAN 2.5 MG PO TABS: 2.5000 mg | ORAL_TABLET | Freq: Two times a day (BID) | ORAL | 3 refills | Status: DC

## 2017-01-28 MED ORDER — METOPROLOL TARTRATE 25 MG PO TABS: 25.0000 mg | ORAL_TABLET | Freq: Two times a day (BID) | ORAL | 3 refills | Status: DC

## 2017-01-28 NOTE — Patient Instructions (Addendum)
Medication Instructions:  1. START ELIQUIS 2.5 MG 1 TABLET WICE DAILY; RX HAS BEEN SENT IN  2. START METOPROLOL TARTRATE 25 MG 1 TABLET TWICE DAILY; RX HAS BEEN SENT IN  Labwork: 1. TODAY CBC, BMET  Testing/Procedures: Your physician has requested that you have an echocardiogram. Echocardiography is a painless test that uses sound waves to create images of your heart. It provides your doctor with information about the size and shape of your heart and how well your heart's chambers and valves are working. This procedure takes approximately one hour. There are no restrictions for this procedure.   Follow-Up: 3 WEEKS WITH Nada Boozer, NP SAME DAY DR. Ladona Ridgel IS IN THE OFFICE  Any Other Special Instructions Will Be Listed Below (If Applicable).  MONITOR BLOOD PRESSURE AND CALL IS SYSTOLIC (TOP NUMBER) OF BLOOD PRESSURE IS RUNNING 110 OR LOWER OR IF YOU FEEL MORE DIZZY    If you need a refill on your cardiac medications before your next appointment, please call your pharmacy.

## 2017-01-28 NOTE — Telephone Encounter (Signed)
Lmtcb per Nada Boozer, NP to advised pt since we are starting pt on Eliquis 2.5 mg BID, that pt will need to stop the ASA. LMOM for ptcb to confirm that they did receive my message to stop ASA as soon as pt starts Eliquis.

## 2017-01-28 NOTE — Progress Notes (Signed)
Cardiology Office Note   Date:  01/28/2017   ID:  [name], DOB 13-Feb-1926, MRN [id number]  PCP:  Irving Copas, MD  Cardiologist:  Dr. Katrinka Blazing    Chief Complaint  Patient presents with  . Coronary Artery Disease      History of Present Illness: [name] is a 83 y.o. female who presents for CAD with hx of CABG. Other hx of , hypertension, hyperlipidemia, and diabetes mellitus.  Today no complaints, no chest pain and no SOB.  Has been trying to lose wt. She is proud of this.  She rests most nights.  She still cares for her daughter that is disabled and has her whole life.  Her son helps with this now.  She has had a few falls this past year one with dislocated shoulder but this was treated.  She uses a walker to walk. She still drives.   Last cath 2006 with patent VGs and LIMA, normal EF and severe native vessel disease.   She has no awareness of fast or irregular HR.  EKG with a flutter today, on review her daughter thinks she has decreased energy in last 4 months or so.  No other EKGS to review and last one here 11/2015 was SR.     Past Medical History:  Diagnosis Date  . CAD (coronary artery disease)   . Diabetes (HCC)   . Hip fracture (HCC)   . HTN (hypertension)   . Hypercholesteremia   . Hyperlipidemia   . Hypothyroidism   . Hypothyroidism   . Osteoarthritis   . Osteopenia     Past Surgical History:  Procedure Laterality Date  . APPENDECTOMY    . CORONARY ARTERY BYPASS GRAFT     with LIMA to LAD,sequential SVG to OM # 1 & 3, SVG to PDA. All grafts patent with normal LV function 2006, followed by cardiologist Dr. Katrinka Blazing  . HEMORRHOID SURGERY       Current Outpatient Prescriptions  Medication Sig Dispense Refill  . amLODipine (NORVASC) 2.5 MG tablet Take 2.5 mg by mouth daily.    Marland Kitchen aspirin 81 MG tablet Take 81 mg by mouth daily.    . calcium carbonate 1250 MG capsule Take 1,200 mg by mouth daily.     . Cholecalciferol (VITAMIN D-3) 1000  UNITS CAPS Take 1 capsule by mouth daily.    Marland Kitchen CINNAMON PO Take 1 tablet by mouth daily.    . Cyanocobalamin (VITAMIN B 12 PO) Take 1,000 mg by mouth daily.    . Flaxseed, Linseed, (FLAX SEED OIL) 1000 MG CAPS Take 1 capsule by mouth daily.    . folic acid (FOLVITE) 400 MCG tablet Take 400 mcg by mouth daily.    . hydrochlorothiazide (HYDRODIURIL) 25 MG tablet Take 25 mg by mouth daily.    Marland Kitchen levothyroxine (SYNTHROID, LEVOTHROID) 100 MCG tablet Take 100 mcg by mouth daily before breakfast.    . losartan (COZAAR) 100 MG tablet Take 100 mg by mouth daily.    . Multiple Vitamins-Minerals (CENTRUM SILVER PO) Take 1 tablet by mouth daily.    . Omega-3 1000 MG CAPS Take by mouth.    . pyridOXINE (VITAMIN B-6) 100 MG tablet Take 100 mg by mouth daily.    . simvastatin (ZOCOR) 10 MG tablet Take 10 mg by mouth daily.  0  . apixaban (ELIQUIS) 2.5 MG TABS tablet Take 1 tablet (2.5 mg total) by mouth 2 (two) times daily. 180 tablet 3  . metoprolol tartrate (  LOPRESSOR) 25 MG tablet Take 1 tablet (25 mg total) by mouth 2 (two) times daily. 180 tablet 3   No current facility-administered medications for this visit.     Allergies:   Ace inhibitors    Social History:  The patient  reports that she has never smoked. She has never used smokeless tobacco. She reports that she does not drink alcohol or use drugs.   Family History:  The patient's family history includes Alzheimer's disease in her brother; Heart disease in her mother.    ROS:  General:no colds or fevers, no weight changes Skin:no rashes or ulcers HEENT:no blurred vision, no congestion CV:see HPI PUL:see HPI GI:no diarrhea constipation or melena, no indigestion GU:no hematuria, no dysuria MS:no joint pain, no claudication, + foot pain followed by PCP.   Neuro:no syncope, no lightheadedness Endo:hx of diabetes, + thyroid disease followed by PCP  Wt Readings from Last 3 Encounters:  01/28/17 132 lb 12.8 oz (60.2 kg)  12/19/15 141 lb 6.4  oz (64.1 kg)  12/03/14 149 lb (67.6 kg)     PHYSICAL EXAM: VS:  BP 140/60   Pulse (!) 116   Ht  (1.575 m)   Wt 132 lb 12.8 oz (60.2 kg)   BMI 24.29 kg/m  , BMI Body mass index is 24.29 kg/m. General:Pleasant affect, NAD Skin:Warm and dry, brisk capillary refill HEENT:normocephalic, sclera clear, mucus membranes moist, hard of hearing  Neck:supple, no JVD, no bruits  Heart: irreg IRRR without murmur, gallup, rub or click Lungs:clear without rales, rhonchi, or wheezes ZDG:LOVF, non tender, + BS, do not palpate liver spleen or masses Ext:no lower ext edema, 2+ pedal pulses, 2+ radial pulses Neuro:alert and oriented X 3, MAE, follows commands, + facial symmetry    EKG:  EKG is ordered today. The ekg ordered today demonstrates new  A flutter with RVR at 110.  ST abnormality inf leads may be due to flutter.     Recent Labs: No results found for requested labs within last 8760 hours.    Lipid Panel No results found for: CHOL, TRIG, HDL, CHOLHDL, VLDL, LDLCALC, LDLDIRECT     Other studies Reviewed: Additional studies/ records that were reviewed today include: previous cath as above. .previous notes   ASSESSMENT AND PLAN:  1.  A flutter new with RVR- unsure how long she has been in, less stamina began 3 months or so ago.  Discussed with pt and daughter and Dr. Ladona Ridgel, pt has fallen 3 times in last year but her CHA2DS2CVASc is 17 (age, HTN, DM, vascular disease)  I discussed the risk of bleeding with risk of stroke and we would also like to have her back in SR to improve her energy.  Placing on Eliquis 2.5 mg BID will stop Asprin.  Will check BMP, TSH and CBC today.  2 D echo and then have have pt return in 3 weeks to evaluate and set up for DCCV at that time. Will plan DCCV with Dr. Ladona Ridgel and he should be here wihen I see pt back.   2. HTN  Elevated today but daughter states it is lower at home.  She does check BP daily and if systolic < 110 she will call and we will stop  amlodipine, if much lower would stop Hctz.  We added lopressor  25 mg BID.    3. CAD with CABG and patent grafts in 2002 and 2006.  No chest pain.    4. HLD followed by PCP continue statin.  Current medicines are reviewed with the patient today.  The patient Has no concerns regarding medicines.  The following changes have been made:  See above Labs/ tests ordered today include:see above  Disposition:   FU:  see above  Signed, Nada Boozer, NP  01/28/2017 5:09 PM    [facility] [address] [place], [place], [place]  [address]/ [address] [place], [place] Phone: [phone]; Fax: [phone]  [phone]

## 2017-01-29 ENCOUNTER — Telehealth: Payer: Self-pay | Admitting: Cardiology

## 2017-01-29 LAB — BASIC METABOLIC PANEL
BUN/Creatinine Ratio: 29 — ABNORMAL HIGH (ref 12–28)
BUN: 28 mg/dL — ABNORMAL HIGH (ref 8–27)
CO2: 23 mmol/L (ref 18–29)
Calcium: 10.4 mg/dL — ABNORMAL HIGH (ref 8.7–10.3)
Chloride: 102 mmol/L (ref 96–106)
Creatinine, Ser: 0.95 mg/dL (ref 0.57–1.00)
GFR calc Af Amer: 62 mL/min/{1.73_m2} (ref >59)
GFR calc non Af Amer: 54 mL/min/{1.73_m2} — ABNORMAL LOW (ref >59)
Glucose: 100 mg/dL — ABNORMAL HIGH (ref 65–99)
Potassium: 4.4 mmol/L (ref 3.5–5.2)
Sodium: 145 mmol/L — ABNORMAL HIGH (ref 134–144)

## 2017-01-29 LAB — CBC
Hematocrit: 39.4 % (ref 34.0–46.6)
Hemoglobin: 13.3 g/dL (ref 11.1–15.9)
MCH: 29.7 pg (ref 26.6–33.0)
MCHC: 33.8 g/dL (ref 31.5–35.7)
MCV: 88 fL (ref 79–97)
Platelets: 243 10*3/uL (ref 150–379)
RBC: 4.48 x10E6/uL (ref 3.77–5.28)
RDW: 14.3 % (ref 12.3–15.4)
WBC: 9.9 10*3/uL (ref 3.4–10.8)

## 2017-01-29 NOTE — Telephone Encounter (Signed)
New message   Pt is calling to ask questions about her medication. She said she is confused.

## 2017-01-29 NOTE — Telephone Encounter (Signed)
Returned pts call and clarified for her to stop the Aspirin since she is starting the Eliquis.  Pt verbalized understanding and will stop ASA.

## 2017-01-30 ENCOUNTER — Telehealth: Payer: Self-pay | Admitting: Cardiology

## 2017-01-30 NOTE — Telephone Encounter (Signed)
New message    Pt is calling stating she checked her pulse and it is 55.

## 2017-01-31 ENCOUNTER — Encounter: Payer: Self-pay | Admitting: Cardiology

## 2017-02-05 ENCOUNTER — Telehealth: Payer: Self-pay | Admitting: Interventional Cardiology

## 2017-02-05 NOTE — Telephone Encounter (Signed)
Spoke with daughter, Clint Lipps (DPR) and she asked if echo tomorrow was necessary.  Pt was concerned about test and felt it maybe uneccessary since her HR has been much better since starting Metoprolol.  Advised daughter pt needed to proceed with echo as scheduled so we can get a better look at structure and valves.  Daughter verbalized understanding and was appreciative for call.

## 2017-02-05 NOTE — Telephone Encounter (Signed)
New Message    Pt daughter calling regarding her mother's echo appt tomorrow. She's concerned and not quite sure about the appt. Has some questions & requesting call back

## 2017-02-06 ENCOUNTER — Ambulatory Visit (HOSPITAL_COMMUNITY): Payer: Medicare Other | Attending: Internal Medicine

## 2017-02-06 ENCOUNTER — Other Ambulatory Visit: Payer: Self-pay

## 2017-02-06 DIAGNOSIS — I2510 Atherosclerotic heart disease of native coronary artery without angina pectoris: Secondary | ICD-10-CM | POA: Diagnosis not present

## 2017-02-06 DIAGNOSIS — I071 Rheumatic tricuspid insufficiency: Secondary | ICD-10-CM | POA: Insufficient documentation

## 2017-02-06 DIAGNOSIS — I340 Nonrheumatic mitral (valve) insufficiency: Secondary | ICD-10-CM | POA: Insufficient documentation

## 2017-02-06 DIAGNOSIS — I10 Essential (primary) hypertension: Secondary | ICD-10-CM | POA: Insufficient documentation

## 2017-02-06 DIAGNOSIS — E785 Hyperlipidemia, unspecified: Secondary | ICD-10-CM | POA: Insufficient documentation

## 2017-02-06 DIAGNOSIS — I4892 Unspecified atrial flutter: Secondary | ICD-10-CM | POA: Diagnosis not present

## 2017-02-18 ENCOUNTER — Encounter: Payer: Self-pay | Admitting: Cardiology

## 2017-02-18 ENCOUNTER — Ambulatory Visit (INDEPENDENT_AMBULATORY_CARE_PROVIDER_SITE_OTHER): Payer: Medicare Other | Admitting: Cardiology

## 2017-02-18 VITALS — BP 151/76 | HR 50 | Ht 62.0 in | Wt 135.0 lb

## 2017-02-18 DIAGNOSIS — I10 Essential (primary) hypertension: Secondary | ICD-10-CM

## 2017-02-18 DIAGNOSIS — I4892 Unspecified atrial flutter: Secondary | ICD-10-CM | POA: Diagnosis not present

## 2017-02-18 DIAGNOSIS — I2510 Atherosclerotic heart disease of native coronary artery without angina pectoris: Secondary | ICD-10-CM

## 2017-02-18 DIAGNOSIS — R001 Bradycardia, unspecified: Secondary | ICD-10-CM

## 2017-02-18 DIAGNOSIS — Z7901 Long term (current) use of anticoagulants: Secondary | ICD-10-CM

## 2017-02-18 NOTE — Patient Instructions (Addendum)
Hold Metoprolol for 2 days then restart Metoprolol 25 mg 1/2 tablet daily   If Heart Rate below 55 call office   Appointment with Dr.Smith Monday 04/28/17 at 2:00 pm.

## 2017-02-18 NOTE — Progress Notes (Signed)
Cardiology Office Note   Date:  02/18/2017   ID:  [name], DOB 06/12/1926, MRN [id number]  PCP:  Irving Copas, MD  Cardiologist:  Dr. Katrinka Blazing    Chief Complaint  Patient presents with  . Atrial Flutter      History of Present Illness: [name] is a 83 y.o. female who presents for atrial flutter eval and follow up after adding NOAC - plan if a flutter persists to arrange DCCV.  Echo with EF 60-65% with moderate LVH and no RWMA.  G1 DD. Mild MR, LA moderately dilated mild TR, PA PK pressure 26.   CAD with hx of CABG  Today she feels well no chest pain and no SOB.  She is still caring for her daughter that is bed bound.  Her son does help.  We reviewed her Echo results. Her daughter is with her today.   She is now in [place].  She will hold lopressor for 2 days then resume at 12.5 once a day, this is an abnormal dose but would like a little BB, but if HR remains low would stop.  She has no lightheadedness or dizziness.  Past Medical History:  Diagnosis Date  . CAD (coronary artery disease)   . Diabetes (HCC)   . Hip fracture (HCC)   . HTN (hypertension)   . Hypercholesteremia   . Hyperlipidemia   . Hypothyroidism   . Hypothyroidism   . Osteoarthritis   . Osteopenia     Past Surgical History:  Procedure Laterality Date  . APPENDECTOMY    . CORONARY ARTERY BYPASS GRAFT     with LIMA to LAD,sequential SVG to OM # 1 & 3, SVG to PDA. All grafts patent with normal LV function 2006, followed by cardiologist Dr. Katrinka Blazing  . HEMORRHOID SURGERY       Current Outpatient Prescriptions  Medication Sig Dispense Refill  . amLODipine (NORVASC) 2.5 MG tablet Take 2.5 mg by mouth daily.    Marland Kitchen apixaban (ELIQUIS) 2.5 MG TABS tablet Take 1 tablet (2.5 mg total) by mouth 2 (two) times daily. 180 tablet 3  . calcium carbonate 1250 MG capsule Take 1,200 mg by mouth daily.     . Cholecalciferol (VITAMIN D-3) 1000 UNITS CAPS Take 1 capsule by mouth daily.    Marland Kitchen  CINNAMON PO Take 1 tablet by mouth daily.    . Cyanocobalamin (VITAMIN B 12 PO) Take 1,000 mg by mouth daily.    . Flaxseed, Linseed, (FLAX SEED OIL) 1000 MG CAPS Take 1 capsule by mouth daily.    . folic acid (FOLVITE) 400 MCG tablet Take 400 mcg by mouth daily.    . hydrochlorothiazide (HYDRODIURIL) 25 MG tablet Take 25 mg by mouth daily.    Marland Kitchen levothyroxine (SYNTHROID, LEVOTHROID) 100 MCG tablet Take 100 mcg by mouth daily before breakfast.    . metoprolol tartrate (LOPRESSOR) 25 MG tablet Take 1 tablet (25 mg total) by mouth 2 (two) times daily. 180 tablet 3  . Multiple Vitamins-Minerals (CENTRUM SILVER PO) Take 1 tablet by mouth daily.    . Omega-3 1000 MG CAPS Take by mouth.    . pyridOXINE (VITAMIN B-6) 100 MG tablet Take 100 mg by mouth daily.    . simvastatin (ZOCOR) 10 MG tablet Take 10 mg by mouth daily.  0   No current facility-administered medications for this visit.     Allergies:   Ace inhibitors    Social History:  The patient  reports that she  has never smoked. She has never used smokeless tobacco. She reports that she does not drink alcohol or use drugs.   Family History:  The patient's family history includes Alzheimer's disease in her brother; Heart disease in her mother.    ROS:  General:no colds or fevers, no weight changes Skin:no rashes or ulcers HEENT:no blurred vision, no congestion CV:see HPI PUL:see HPI GI:no diarrhea constipation or melena, no indigestion GU:no hematuria, no dysuria MS:no joint pain, no claudication Neuro:no syncope, no lightheadedness Endo:no diabetes, no thyroid disease  Wt Readings from Last 3 Encounters:  02/18/17 135 lb (61.2 kg)  01/28/17 132 lb 12.8 oz (60.2 kg)  12/19/15 141 lb 6.4 oz (64.1 kg)     PHYSICAL EXAM: VS:  BP (!) 151/76   Pulse (!) 50   Ht  (1.575 m)   Wt 135 lb (61.2 kg)   BMI 24.69 kg/m  , BMI Body mass index is 24.69 kg/m. General:Pleasant affect, NAD Skin:Warm and dry, brisk capillary  refill HEENT:normocephalic, sclera clear, mucus membranes moist Heart:S1S2 RRR without murmur, gallup, rub or click Lungs:clear without rales, rhonchi, or wheezes WUX:LKGM, non tender, + BS, do not palpate liver spleen or masses Ext:no lower ext edema, 2+ pedal pulses, 2+ radial pulses Neuro:alert and oriented X 3, MAE, follows commands, + facial symmetry    EKG:  EKG is ordered today. The ekg ordered today demonstrates Sinus brady at 48 and now out of a fib but no ST changes.    Recent Labs: 01/28/2017: BUN 28; Creatinine, Ser 0.95; Platelets 243; Potassium 4.4; Sodium 145    Lipid Panel No results found for: CHOL, TRIG, HDL, CHOLHDL, VLDL, LDLCALC, LDLDIRECT     Other studies Reviewed: Additional studies/ records that were reviewed today include: .  ECHO 02/06/17 Study Conclusions  - Left ventricle: The cavity size was normal. Wall thickness was   increased in a pattern of moderate LVH. Systolic function was   normal. The estimated ejection fraction was in the range of 60%   to 65%. Wall motion was normal; there were no regional wall   motion abnormalities. Doppler parameters are consistent with   abnormal left ventricular relaxation (grade 1 diastolic   dysfunction). The E/e&' ratio is between 8-15, suggesting   indeterminate LV filling pressure. - Aortic valve: Trileaflet. Sclerosis without stenosis. There was   no regurgitation. - Mitral valve: Mildly thickened leaflets . There was mild   regurgitation. - Left atrium: Moderately dilated. - Tricuspid valve: There was mild regurgitation. - Pulmonary arteries: PA peak pressure: 26 mm Hg (S). - Inferior vena cava: The vessel was normal in size. The   respirophasic diameter changes were in the normal range (= 50%),   consistent with normal central venous pressure.  Impressions:  - LVEF 60-65%, moderate LVH, normal wall motion, grade 1 DD with   indeterminate LV filling pressure, aortic valve sclerosis, mild   MR,  moderate LAE, mild TR, RVSP 26 mmHg, normal IVC.  ASSESSMENT AND PLAN:  1.  Persistent a fib, now converted with BB.  continue Eliquis.  She will follow up with Dr. Katrinka Blazing in 8 weeks.   2. Sinus brady I have asked her to stop BB for 2 days then resume at 12.5 once a day-  She may not be able to take that much.  She will call if HR is less than 55.  Not symptomatic.  3. HTN elevated here- at her home it is 135/63 will not adjust her meds otherwise.  Today she tells me she has been off losartan for a long time.   4.  CAD with CABG and patent grafts in 2002 and 2006, no chest pain.    5. anticoagulation CHA2DS2CVASc is 72 (age, HTN, DM, vascular disease)  6. HLD followed by PCP  Current medicines are reviewed with the patient today.  The patient Has no concerns regarding medicines.  The following changes have been made:  See above Labs/ tests ordered today include:see above  Disposition:   FU:  see above  Signed, Nada Boozer, NP  02/18/2017 4:01 PM    [facility] [address] [place], [place], [place]  [address]/ [address] [place], [place] Phone: [phone]; Fax: [phone]  [phone]

## 2017-02-19 ENCOUNTER — Telehealth: Payer: Self-pay | Admitting: Cardiology

## 2017-02-19 NOTE — Telephone Encounter (Signed)
New message    Pt is calling stating she has checked her pulse and it is 59.

## 2017-02-19 NOTE — Telephone Encounter (Signed)
Returned call to patient. She states she was contacted by our office by someone this AM to let her know to call if HR was under 55bpm. She states at the time of the call she had not had time to check her pulse d/t being busy w/her sick daughter and wanted to provide an update that her HR was 59. Reviewed NP instructions per visit 4/24 w/patient and she voiced understanding. No further action needed.   Per L. Annie Paras, NP 02/18/17 ASSESSMENT AND PLAN:  1.  Persistent a fib, now converted with BB.  continue Eliquis.  She will follow up with Dr. Katrinka Blazing in 8 weeks.   2. Sinus brady I have asked her to stop BB for 2 days then resume at 12.5 once a day-  She may not be able to take that much.  She will call if HR is less than 55.  Not symptomatic.

## 2017-03-17 ENCOUNTER — Ambulatory Visit: Payer: Medicare Other | Admitting: Podiatry

## 2017-04-28 ENCOUNTER — Ambulatory Visit: Payer: Medicare Other | Admitting: Interventional Cardiology

## 2017-05-13 ENCOUNTER — Ambulatory Visit (INDEPENDENT_AMBULATORY_CARE_PROVIDER_SITE_OTHER): Payer: Medicare Other | Admitting: Interventional Cardiology

## 2017-05-13 ENCOUNTER — Encounter: Payer: Self-pay | Admitting: Interventional Cardiology

## 2017-05-13 VITALS — BP 180/72 | HR 62 | Ht 62.0 in | Wt 134.0 lb

## 2017-05-13 DIAGNOSIS — I4892 Unspecified atrial flutter: Secondary | ICD-10-CM | POA: Insufficient documentation

## 2017-05-13 DIAGNOSIS — E7849 Other hyperlipidemia: Secondary | ICD-10-CM

## 2017-05-13 DIAGNOSIS — I25709 Atherosclerosis of coronary artery bypass graft(s), unspecified, with unspecified angina pectoris: Secondary | ICD-10-CM | POA: Diagnosis not present

## 2017-05-13 DIAGNOSIS — R001 Bradycardia, unspecified: Secondary | ICD-10-CM | POA: Diagnosis not present

## 2017-05-13 DIAGNOSIS — I10 Essential (primary) hypertension: Secondary | ICD-10-CM | POA: Diagnosis not present

## 2017-05-13 DIAGNOSIS — Z7901 Long term (current) use of anticoagulants: Secondary | ICD-10-CM | POA: Diagnosis not present

## 2017-05-13 DIAGNOSIS — E784 Other hyperlipidemia: Secondary | ICD-10-CM | POA: Diagnosis not present

## 2017-05-13 HISTORY — DX: Unspecified atrial flutter: I48.92

## 2017-05-13 HISTORY — DX: Long term (current) use of anticoagulants: Z79.01

## 2017-05-13 MED ORDER — APIXABAN 2.5 MG PO TABS: 2.5000 mg | ORAL_TABLET | Freq: Two times a day (BID) | ORAL | 3 refills | Status: DC

## 2017-05-13 NOTE — Patient Instructions (Signed)
Medication Instructions:  None  Labwork: None  Testing/Procedures: None  Follow-Up: Your physician wants you to follow-up in: 6 months with Dr. Smith.  You will receive a reminder letter in the mail two months in advance. If you don't receive a letter, please call our office to schedule the follow-up appointment.   Any Other Special Instructions Will Be Listed Below (If Applicable).     If you need a refill on your cardiac medications before your next appointment, please call your pharmacy.   

## 2017-05-13 NOTE — Progress Notes (Signed)
Cardiology Office Note    Date:  05/13/2017   ID:  [name], DOB [birth year], MRN [id number]  PCP:  Henrine Screws, MD  Cardiologist: Lesleigh Noe, MD   Chief Complaint  Patient presents with  . Coronary Artery Disease  . Congestive Heart Failure    History of Present Illness:  [name] is a 83 y.o. female with coronary artery disease with prior coronary bypass grafting, hypertension, hyperlipidemia, and diabetes mellitus.Recent atrial flutter which reverted without cardioversion.  She is doing well. She wonders if she will need indefinite anticoagulation therapy. CHADS VASC is 5. We discussed the stroke risk and the recommendation that she should continue reduced dose Eliquis as long as no complications or bleeding.    Past Medical History:  Diagnosis Date  . CAD (coronary artery disease)   . Diabetes (HCC)   . Hip fracture (HCC)   . HTN (hypertension)   . Hypercholesteremia   . Hyperlipidemia   . Hypothyroidism   . Hypothyroidism   . Osteoarthritis   . Osteopenia     Past Surgical History:  Procedure Laterality Date  . APPENDECTOMY    . CORONARY ARTERY BYPASS GRAFT     with LIMA to LAD,sequential SVG to OM # 1 & 3, SVG to PDA. All grafts patent with normal LV function 2006, followed by cardiologist Dr. Katrinka Blazing  . HEMORRHOID SURGERY      Current Medications: Outpatient Medications Prior to Visit  Medication Sig Dispense Refill  . amLODipine (NORVASC) 2.5 MG tablet Take 2.5 mg by mouth daily.    . calcium carbonate 1250 MG capsule Take 1,200 mg by mouth daily.     Marland Kitchen CINNAMON PO Take 1 tablet by mouth daily.    . Cyanocobalamin (VITAMIN B 12 PO) Take 1,000 mg by mouth daily.    . Flaxseed, Linseed, (FLAX SEED OIL) 1000 MG CAPS Take 1 capsule by mouth daily.    . folic acid (FOLVITE) 400 MCG tablet Take 400 mcg by mouth daily.    . hydrochlorothiazide (HYDRODIURIL) 25 MG tablet Take 25 mg by mouth daily.    Marland Kitchen levothyroxine (SYNTHROID,  LEVOTHROID) 100 MCG tablet Take 100 mcg by mouth daily before breakfast.    . Multiple Vitamins-Minerals (CENTRUM SILVER PO) Take 1 tablet by mouth daily.    . Omega-3 1000 MG CAPS Take by mouth.    . pyridOXINE (VITAMIN B-6) 100 MG tablet Take 100 mg by mouth daily.    . simvastatin (ZOCOR) 10 MG tablet Take 10 mg by mouth daily.  0  . apixaban (ELIQUIS) 2.5 MG TABS tablet Take 1 tablet (2.5 mg total) by mouth 2 (two) times daily. 180 tablet 3  . Cholecalciferol (VITAMIN D-3) 1000 UNITS CAPS Take 1 capsule by mouth daily.    . metoprolol tartrate (LOPRESSOR) 25 MG tablet Take 1 tablet (25 mg total) by mouth 2 (two) times daily. 180 tablet 3   No facility-administered medications prior to visit.      Allergies:   Ace inhibitors   Social History   Social History  . Marital status: Widowed    Spouse name: N/A  . Number of children: N/A  . Years of education: N/A   Social History Main Topics  . Smoking status: Never Smoker  . Smokeless tobacco: Never Used  . Alcohol use No  . Drug use: No  . Sexual activity: Not Asked   Other Topics Concern  . None   Social History Narrative  .  None     Family History:  The patient's family history includes Alzheimer's disease in her brother; Heart disease in her mother.   ROS:   Please see the history of present illness.    She has difficulty with balance. She has not fallen. Decreased memory and decreased hearing. She is losing weight but feels that much of it is purposeful.  All other systems reviewed and are negative.   PHYSICAL EXAM:   VS:  BP (!) 180/72 (BP Location: Left Arm)   Pulse 62   Ht 5\' 2"  (1.575 m)   Wt 134 lb (60.8 kg)   BMI 24.51 kg/m    GEN: Well nourished, well developed, in no acute distress  HEENT: normal  Neck: no JVD, carotid bruits, or masses Cardiac: RRR; There is a 2/6 systolic murmur and no diastolic; No rubs, or gallops,no edema . Respiratory:  clear to auscultation bilaterally, normal work of  breathing GI: soft, nontender, nondistended, + BS MS: no deformity or atrophy  Skin: warm and dry, no rash Neuro:  Alert and Oriented x 3, Strength and sensation are intact Psych: euthymic mood, full affect  Wt Readings from Last 3 Encounters:  05/13/17 134 lb (60.8 kg)  02/18/17 135 lb (61.2 kg)  01/28/17 132 lb 12.8 oz (60.2 kg)      Studies/Labs Reviewed:   EKG:  EKG  EKG is not performed.  Recent Labs: 01/28/2017: BUN 28; Creatinine, Ser 0.95; Hemoglobin 13.3; Platelets 243; Potassium 4.4; Sodium 145   Lipid Panel No results found for: CHOL, TRIG, HDL, CHOLHDL, VLDL, LDLCALC, LDLDIRECT  Additional studies/ records that were reviewed today include:  None    ASSESSMENT:    1. Coronary artery disease involving coronary bypass graft of native heart with angina pectoris (HCC)   2. Essential hypertension, benign   3. Atrial flutter, unspecified type (HCC)   4. Chronic anticoagulation   5. Other hyperlipidemia   6. Bradycardia      PLAN:  In order of problems listed above:  1. Stable without angina pectoris. 2. Significant systolic elevation. The blood pressure was rechecked and is in the range noted. She will do daily blood pressures between 3 and 5 PM and report them to [place] in one week. Target blood pressure should be less than 150/90 mmHg. Low-salt diet distress. 3. Not currently an issue. Heart rate is 62 bpm. Had spontaneous conversion without requiring electrical shock. CHADS VASC is 5. 4. Eliquis will be continued probably indefinitely. We will reconsider when I see her in 4-6 months. 5. Not discussed. 6. The heart rate is under good control and not too slow. No changes needed.  She will record her blood pressures at home. If they remain above 150 systolic we will need to increase amlodipine to 5 mg per day. I will otherwise plan to see her back in 4-6 months. We will rediscuss anticoagulation at that time.  Medication Adjustments/Labs and Tests Ordered: Current  medicines are reviewed at length with the patient today.  Concerns regarding medicines are outlined above.  Medication changes, Labs and Tests ordered today are listed in the Patient Instructions below. Patient Instructions  Medication Instructions:  None  Labwork: None  Testing/Procedures: None  Follow-Up: Your physician wants you to follow-up in: 6 months with Dr. Katrinka Blazing.  You will receive a reminder letter in the mail two months in advance. If you don't receive a letter, please call our office to schedule the follow-up appointment.   Any Other Special Instructions Will  Be Listed Below (If Applicable).     If you need a refill on your cardiac medications before your next appointment, please call your pharmacy.      Signed, Lesleigh NoeHenry W Kahlin Mark III, MD  05/13/2017 5:33 PM    [facility] [address], [place], [place]  [address] Phone: [phone]; Fax: [phone]

## 2017-05-14 ENCOUNTER — Telehealth: Payer: Self-pay | Admitting: Interventional Cardiology

## 2017-05-14 NOTE — Telephone Encounter (Signed)
Spoke with pt and made her aware that BP was fine.  States she feels great.  Advised her to continue to monitor and to call me next week with a weeks worth of readings.  Pt verbalized understanding and was in agreement with this plan.

## 2017-05-14 NOTE — Telephone Encounter (Signed)
Pt c/o BP issue: STAT if pt c/o blurred vision, one-sided weakness or slurred speech  1. What are your last 5 BP readings? 137/63  2. Are you having any other symptoms (ex. Dizziness, headache, blurred vision, passed out)? no  3. What is your BP issue? BP running high   Patient states that she was advised by Dr. Katrinka BlazingSmith  to send in BP readings for a week.

## 2017-05-21 ENCOUNTER — Telehealth: Payer: Self-pay | Admitting: Interventional Cardiology

## 2017-05-21 NOTE — Telephone Encounter (Signed)
The blood pressures are b=very good and no change in medication regimen is needed.

## 2017-05-21 NOTE — Telephone Encounter (Signed)
Spoke with pt and she has been feeling good.  No dizziness, lightheadedness or other cardiac issues since last seen.  She called today to give BP readings as requested at last OV.  Advised pt BPs look good and to continue what she's been doing.  Will route to Dr. Katrinka BlazingSmith to see if he has further recommendations.  Pt appreciative for call.

## 2017-05-21 NOTE — Telephone Encounter (Signed)
New Message    Pt c/o BP issue: STAT if pt c/o blurred vision, one-sided weakness or slurred speech  1. What are your last 5 BP readings?  Taken at 3-4p in afternoon  Wed 137/63  thurs 123/62 fri 121/66 sat 125/60 Sunday 125/53 Monday 116/60 Tuesday 128/53     3. What is your BP issue?  Dr Katrinka BlazingSmith wanted her to document for the last week

## 2017-05-23 ENCOUNTER — Telehealth: Payer: Self-pay | Admitting: Interventional Cardiology

## 2017-05-23 MED ORDER — METOPROLOL TARTRATE 25 MG PO TABS: 12.5000 mg | ORAL_TABLET | Freq: Every day | ORAL | Status: DC

## 2017-05-23 NOTE — Telephone Encounter (Signed)
This is confusing and I am not sure where this question arises. What is she taking? She should remain on what has been prescribed. Lopressor is BID. Should be 12.5 mg BID.

## 2017-05-23 NOTE — Telephone Encounter (Signed)
Patient calling, states that she is confused about how to take metoprolol tartrate 25 mg. Patient states that she has conflicting instructions.

## 2017-05-23 NOTE — Telephone Encounter (Signed)
Per pt  Was only taking  Metoprolol 12.5 mg qd at last office visit will let Dr Katrinka BlazingSmith know .Zack Seal/cy

## 2017-05-26 ENCOUNTER — Other Ambulatory Visit: Payer: Self-pay | Admitting: *Deleted

## 2017-05-26 MED ORDER — METOPROLOL TARTRATE 25 MG PO TABS: 12.5000 mg | ORAL_TABLET | Freq: Every day | ORAL | 6 refills | Status: DC

## 2017-05-26 NOTE — Telephone Encounter (Signed)
No answer phone just rings will try later ./cy

## 2017-05-26 NOTE — Telephone Encounter (Signed)
Lm to call back ./cy 

## 2017-05-27 MED ORDER — METOPROLOL TARTRATE 25 MG PO TABS: 12.5000 mg | ORAL_TABLET | Freq: Two times a day (BID) | ORAL | 3 refills | Status: DC

## 2017-05-27 NOTE — Telephone Encounter (Signed)
Follow up    Pt daughter is returning your call for medication instructions

## 2017-05-27 NOTE — Telephone Encounter (Signed)
Reviewed Dr. Michaelle CopasSmith's advice with patient's daughter, Clint LippsLea, who verbalized understanding and agreement that patient should be taking lopressor 12.5 mg BID. She thanked me for the call.

## 2017-05-27 NOTE — Telephone Encounter (Signed)
Left detailed message on voice mail of patient's daughter, Clint LippsLea, to call back to verify patient's instructions for metoprolol.

## 2017-05-28 ENCOUNTER — Telehealth: Payer: Self-pay | Admitting: Interventional Cardiology

## 2017-05-28 NOTE — Telephone Encounter (Signed)
Patient calling, states that she was supposed to call back with BP readings.  1. 130/62 2. 132/58 3. 129/59 4. 132/60 5. 123/61

## 2017-05-28 NOTE — Telephone Encounter (Signed)
Pt aware will forward readings to DR Katrinka BlazingSmith for review ./cy

## 2017-06-01 NOTE — Telephone Encounter (Signed)
Very good. No change is needed.

## 2017-06-02 NOTE — Telephone Encounter (Signed)
Pt aware ./cy 

## 2017-06-20 ENCOUNTER — Telehealth: Payer: Self-pay | Admitting: Interventional Cardiology

## 2017-06-20 NOTE — Telephone Encounter (Signed)
Left message for patient to call back  

## 2017-06-20 NOTE — Telephone Encounter (Signed)
New message    Pt is calling.   Pt c/o medication issue:  1. Name of Medication: eliquis   2. How are you currently taking this medication (dosage and times per day)? 2.5 mg  3. Are you having a reaction (difficulty breathing--STAT)? Diarrhea   4. What is your medication issue? Pt says this medication is making her have diarrhea

## 2017-06-20 NOTE — Telephone Encounter (Signed)
Okay to stop Eliquis.  Start Xarelto 15 mg daily.

## 2017-06-20 NOTE — Telephone Encounter (Signed)
Pt contacted to let her know Dr. Lonn Georgia suggestions. Pt stated she has talked with her PCP recently about the medication causing her diarrhea and he told her to only take the medication once a day and see if that made a difference. Pt educated that is not the appropriate way to take that medication and she would be putting her self at risk for a blood clot. Pt verbalized understanding and stated she feels it is the source of her diarrhea and would like to change to a different medication.  Pt told message would be send to MD to review and we will call her back with recommendations. Pt also instructed to continue taking medication as ordered until she hears from our office. . Pt verbalized understanding.

## 2017-06-20 NOTE — Telephone Encounter (Signed)
Patient is complaining of diarrhea and some stool incontinence, that has be happening on and off since she started eliquis. Patient wants to know if there is something else she can take. Patient stated she has tried taking imodium and nothing helps. Patient stated she would not mind taking it once a day, but it there is something else she can take that would work too. Will forward to Dr. Katrinka Blazing for advisement.

## 2017-06-20 NOTE — Telephone Encounter (Signed)
Needs to speak with PCP about diarrhea to determine if related to medication. Could be something else causing the problem.

## 2017-06-24 NOTE — Telephone Encounter (Signed)
Spoke with pt and went over recommendations per Dr. Katrinka Blazing. Placed samples of Xarelto 15mg  at front desk for pt to pick up.  Pt verbalized understanding and will come by today to pick up medication.

## 2017-06-25 ENCOUNTER — Telehealth: Payer: Self-pay | Admitting: Interventional Cardiology

## 2017-06-25 NOTE — Telephone Encounter (Signed)
Patient had questions about her xarelto. Patient wanted to know when to take it and how often. Informed patient to take Xarelto daily with her heaviest meal of the day. Patient stated lunch was her heaviest meal, so she will take it then. Updated patient's medication list, added Xarelto and took eliquis off patient's list. Patient verbalized understanding.

## 2017-06-25 NOTE — Telephone Encounter (Signed)
New message      Calling to let the nurse know that both legs from knee down feels like she has a tight hose on them.  Legs are not red or swollen.  Please advise

## 2017-06-25 NOTE — Telephone Encounter (Signed)
Follow up     Pt needs more instructions for medication

## 2017-06-25 NOTE — Telephone Encounter (Signed)
Pt states for a week, possibly a little longer, she has felt like she has tight hose on both of her legs from below the knee to the ankle.  Feet do not feel any different.  Denies pain, redness, swelling, numbness, tingling, discoloration or tightness when pushing on them. Advised pt to contact PCP about this issue as it does not sound cardiac related.  Will route to Dr. Katrinka BlazingSmith to see if further recommendations.

## 2017-06-26 NOTE — Telephone Encounter (Signed)
Someone should look at her legs. This should preferably be PCP.

## 2017-06-26 NOTE — Telephone Encounter (Signed)
Attempted to call pt.  Phone rang several times with no answer and no VM.  Will try again later.  

## 2017-06-27 NOTE — Telephone Encounter (Signed)
Spoke with pt and made her aware of recommendations per Dr. Smith.  Pt verbalized understanding.  

## 2017-07-09 ENCOUNTER — Telehealth: Payer: Self-pay | Admitting: Interventional Cardiology

## 2017-07-09 MED ORDER — RIVAROXABAN 15 MG PO TABS: 15.0000 mg | ORAL_TABLET | Freq: Every day | ORAL | 7 refills | Status: DC

## 2017-07-09 NOTE — Telephone Encounter (Signed)
New message      *STAT* If patient is at the pharmacy, call can be transferred to refill team.   1. Which medications need to be refilled? (please list name of each medication and dose if known) xarelto   2. Which pharmacy/location (including street and city if local pharmacy) is medication to be sent to? [facility] [address]  3. Do they need a 30 day or 90 day supply?  30

## 2018-02-23 ENCOUNTER — Other Ambulatory Visit: Payer: Self-pay | Admitting: Interventional Cardiology

## 2018-03-04 ENCOUNTER — Other Ambulatory Visit: Payer: Self-pay

## 2018-03-04 ENCOUNTER — Ambulatory Visit (INDEPENDENT_AMBULATORY_CARE_PROVIDER_SITE_OTHER): Payer: Medicare Other

## 2018-03-04 ENCOUNTER — Ambulatory Visit: Payer: Medicare Other | Admitting: Physician Assistant

## 2018-03-04 ENCOUNTER — Encounter: Payer: Self-pay | Admitting: Physician Assistant

## 2018-03-04 ENCOUNTER — Ambulatory Visit: Payer: Medicare Other

## 2018-03-04 VITALS — BP 136/84 | HR 76 | Temp 98.5°F | Resp 16 | Ht <= 58 in | Wt 132.8 lb

## 2018-03-04 DIAGNOSIS — M25562 Pain in left knee: Secondary | ICD-10-CM

## 2018-03-04 DIAGNOSIS — Z9181 History of falling: Secondary | ICD-10-CM

## 2018-03-04 NOTE — Progress Notes (Signed)
[name]  MRN: [id number] DOB: 16-Jan-1926  PCP: Henrine Screws, MD  Subjective:  Pt is a pleasant 84 year old female who presents to clinic for left knee pain x 1 week. she is here today with her daughter. She fell last week after she got up during the night to urinate and tripped in the bathroom. She fell forward onto her left knee and forehead. Endorses bruising of her face and left knee pain with swelling. Knee pain is worse with walking and bending. She uses a rolling walker with walking.  She denies confusion, HA, change in vision, nausea, vomiting.  She is taking a blood thinner currently.  Her daughter endorses memory changes in the past 6 months.   Pt  has a past medical history of CAD (coronary artery disease), Diabetes (HCC), Hip fracture (HCC), HTN (hypertension), Hypercholesteremia, Hyperlipidemia, Hypothyroidism, Hypothyroidism, Osteoarthritis, and Osteopenia.  Review of Systems  Constitutional: Negative for fatigue.  Gastrointestinal: Negative for nausea and vomiting.  Musculoskeletal: Positive for arthralgias (left knee pain), gait problem and joint swelling (left knee). Negative for back pain, neck pain and neck stiffness.  Skin: Positive for color change.  Neurological: Negative for dizziness, syncope, weakness, light-headedness and headaches.  Psychiatric/Behavioral: Negative for agitation, confusion and dysphoric mood. The patient is not nervous/anxious.     Patient Active Problem List   Diagnosis Date Noted  . Paroxysmal atrial flutter 05/13/2017  . Chronic anticoagulation 05/13/2017  . Diabetes mellitus, type 2 (HCC) 02/15/2014  . Difficulty hearing 02/14/2014  . Problems influencing health status 02/14/2014  . BMI (body mass index), pediatric, 5% to less than 85% for age 40/20/2015  . Coronary artery disease involving coronary bypass graft of native heart with angina pectoris (HCC) 09/21/2013    Class: Chronic  . Essential hypertension, benign  09/21/2013  . Hyperlipidemia 09/21/2013  . Bradycardia 09/21/2013    Class: Chronic  . Cardiac conduction disorder 08/23/2013  . Hypercholesteremia 08/17/2013  . Adult hypothyroidism 08/17/2013  . Disturbance of skin sensation 05/13/2013  . Brash 11/30/2012  . H/O deep venous thrombosis 10/23/2012  . History of cardiovascular surgery 10/23/2012  . Routine general medical examination at a health care facility 08/28/2012    Current Outpatient Medications on File Prior to Visit  Medication Sig Dispense Refill  . amLODipine (NORVASC) 2.5 MG tablet Take 2.5 mg by mouth daily.    . calcium carbonate 1250 MG capsule Take 1,200 mg by mouth daily.     Marland Kitchen CINNAMON PO Take 1 tablet by mouth daily.    . Cyanocobalamin (VITAMIN B 12 PO) Take 1,000 mg by mouth daily.    . Flaxseed, Linseed, (FLAX SEED OIL) 1000 MG CAPS Take 1 capsule by mouth daily.    . folic acid (FOLVITE) 400 MCG tablet Take 400 mcg by mouth daily.    . hydrochlorothiazide (HYDRODIURIL) 25 MG tablet Take 25 mg by mouth daily.    Marland Kitchen levothyroxine (SYNTHROID, LEVOTHROID) 100 MCG tablet Take 100 mcg by mouth daily before breakfast.    . MAGNESIUM PO Take 1 tablet by mouth daily.    . Multiple Vitamins-Minerals (CENTRUM SILVER PO) Take 1 tablet by mouth daily.    . Omega-3 1000 MG CAPS Take by mouth.    . pyridOXINE (VITAMIN B-6) 100 MG tablet Take 100 mg by mouth daily.    . simvastatin (ZOCOR) 10 MG tablet Take 10 mg by mouth daily.  0  . XARELTO 15 MG TABS tablet TAKE 1 TABLET BY MOUTH ONCE DAILY  WITH SUPPER 30 tablet 4  . metoprolol tartrate (LOPRESSOR) 25 MG tablet Take 0.5 tablets (12.5 mg total) by mouth 2 (two) times daily. 90 tablet 3   No current facility-administered medications on file prior to visit.     Allergies  Allergen Reactions  . Ace Inhibitors     Other reaction(s): Other (See Comments) cough     Objective:  BP 136/84 (BP Location: Right Arm, Patient Position: Sitting, Cuff Size: Normal)   Pulse 76    Temp 98.5 F (36.9 C) (Oral)   Resp 16   Ht  (1.422 m)   Wt 132 lb 12.8 oz (60.2 kg)   SpO2 97%   BMI 29.77 kg/m   Physical Exam  Constitutional: She is oriented to person, place, and time. No distress.  HENT:  Head: Head is with contusion (contusion in different phases of healing left face extending to left jaw. ) and with laceration (healed). Head is without raccoon's eyes and without Battle's sign.    Cardiovascular: Normal rate, regular rhythm and normal heart sounds.  Musculoskeletal:       Left knee: She exhibits swelling and bony tenderness (patella ). She exhibits no deformity and no laceration. Tenderness found. Medial joint line tenderness noted. No patellar tendon tenderness noted.  Neurological: She is alert and oriented to person, place, and time.  Skin: Skin is warm and dry.  Psychiatric: Judgment normal.  Vitals reviewed.   Dg Knee Complete 4 Views Left  Result Date: 03/04/2018 CLINICAL DATA:  84 y/o  F; history of recent fall with knee pain. EXAM: LEFT KNEE - COMPLETE 4+ VIEW COMPARISON:  None. FINDINGS: Total knee arthroplasty. No periprosthetic lucency or fracture. Bones are demineralized. Hardware is in good alignment. No joint effusion. Soft tissue swelling anterior to the patella. IMPRESSION: Total knee arthroplasty. No acute fracture or dislocation identified. Soft tissue swelling anterior to patella. Electronically Signed   By: Mitzi Hansen M.D.   On: 03/04/2018 16:13   Dg Patella Left  Result Date: 03/04/2018 CLINICAL DATA:  Fall with anterior knee pain. EXAM: LEFT KNEE - 1-2 VIEW COMPARISON:  03/04/2018 knee radiograph FINDINGS: There is prepatellar soft tissue swelling without patellar fracture. Incompletely visualized left total knee arthroplasty. IMPRESSION: Prepatellar soft tissue swelling without patella fracture. Electronically Signed   By: Deatra Robinson M.D.   On: 03/04/2018 17:10    Assessment and Plan :  1. Acute pain of left knee 2.  History of fall - DG Knee Complete 4 Views Left; Future - DG Patella Left; Future - pt presents c/o left knee pain s/p fall one week ago. Low suspicion for intracranial bleed - I do not think head CT is required. She has swelling and tenderness of left knee, negative x-rays. Ace wrap applied to left knee, pt endorses improvement in pain. Advised RICE principles. RTC in 1 week if no improvement. She understands and agrees with plan.   Marco Collie, PA-C  Primary Care at [facility] 03/04/2018 3:32 PM

## 2018-03-04 NOTE — Patient Instructions (Addendum)
Your x-rays look great! No sign of fracture.   Keep the ace wrap on your knee for the next 2-3 weeks (or until swelling improves)  Keep ice on your knee. Put ice in a plastic bag. Place a towel between your skin and the bag or between your plaster splint and the bag. Leave the ice on for 20 minutes, 2-3 times a day. Keep the area elevated above the level of your heart. Do this 2-3 times a day until swelling improves.  Rest your knee - do not perform activities that make your pain worse.  Elevate your knee above the level of your heart.   Come back and see if you aren't improving in 1-2 weeks.    Thank you for coming in today. I hope you feel we met your needs.  Feel free to call PCP if you have any questions or further requests.  Please consider signing up for MyChart if you do not already have it, as this is a great way to communicate with me.  Best,  Whitney McVey, PA-C  IF you received an x-ray today, you will receive an invoice from [facility]. Please contact [facility] at [phone] with questions or concerns regarding your invoice.   IF you received labwork today, you will receive an invoice from [facility]. Please contact [facility] at [phone] with questions or concerns regarding your invoice.   Our billing staff will not be able to assist you with questions regarding bills from these companies.  You will be contacted with the lab results as soon as they are available. The fastest way to get your results is to activate your My Chart account. Instructions are located on the last page of this paperwork. If you have not heard from [place] regarding the results in 2 weeks, please contact this office.

## 2018-03-05 ENCOUNTER — Telehealth: Payer: Self-pay | Admitting: Interventional Cardiology

## 2018-03-05 DIAGNOSIS — Z9181 History of falling: Secondary | ICD-10-CM | POA: Insufficient documentation

## 2018-03-05 MED ORDER — ASPIRIN EC 81 MG PO TBEC: 81.0000 mg | DELAYED_RELEASE_TABLET | Freq: Every day | ORAL | 3 refills | Status: DC

## 2018-03-05 NOTE — Telephone Encounter (Signed)
Pt c/o medication issue:  1. Name of Medication:   XARELTO 15 MG TABS tablet    2. How are you currently taking this medication (dosage and times per day)? TAKE 1 TABLET BY MOUTH ONCE DAILY WITH SUPPER 3. Are you having a reaction (difficulty breathing--STAT)? no 4. What is your medication issue? Pt daughter verbalized that her mother is very disoriented, today pt don't know who her family is, she states that men are coming in and out of the house but they are nice, Pt fell three times, pt is confusion and dizzy.   Pt daughter verbalized that she and her brother wants their mother off of the medication Xarelto. Pt daughter also insist that she sees Dr.Smith and not a PA.

## 2018-03-05 NOTE — Telephone Encounter (Signed)
Daughter states pt has fallen 3 times recently and on last fall pt hit her head.  Pt was taken to PCP and had UTI that was treated with ABT and deemed to be resolved.  Pt disoriented, confused and dizzy.  Family wants pt off of the Xarelto.  Dr. Katrinka Blazing spoke with daughter and they agreed to d/c Xarelto and start ASA.  Reason for stopping Xarelto is because of the higher risk for bleeding and injury with pt on medication and having falls.  Daughter was educated on higher risk of stroke with pt off of Xarelto.  Daughter verbalized understanding.

## 2018-04-08 ENCOUNTER — Encounter: Payer: Self-pay | Admitting: Podiatry

## 2018-04-08 ENCOUNTER — Ambulatory Visit (INDEPENDENT_AMBULATORY_CARE_PROVIDER_SITE_OTHER): Payer: Medicare Other

## 2018-04-08 ENCOUNTER — Ambulatory Visit: Payer: Medicare Other | Admitting: Podiatry

## 2018-04-08 VITALS — Temp 96.7°F

## 2018-04-08 DIAGNOSIS — B351 Tinea unguium: Secondary | ICD-10-CM | POA: Diagnosis not present

## 2018-04-08 DIAGNOSIS — L989 Disorder of the skin and subcutaneous tissue, unspecified: Secondary | ICD-10-CM | POA: Diagnosis not present

## 2018-04-08 DIAGNOSIS — M79676 Pain in unspecified toe(s): Secondary | ICD-10-CM

## 2018-04-08 DIAGNOSIS — M779 Enthesopathy, unspecified: Secondary | ICD-10-CM

## 2018-04-08 DIAGNOSIS — M79609 Pain in unspecified limb: Secondary | ICD-10-CM

## 2018-04-18 NOTE — Progress Notes (Signed)
    Subjective: Patient is a 84 y.o. female presenting to the office today with a chief complaint of painful callus lesions to the bilateral feet that have been present for the past several months. She also reports pain to the right great toe with associated swelling, erythema and warmth that began two days ago. She has not done anything to treat her symptoms. Touching the toe and walking increases the pain.  Patient also complains of elongated, thickened nails that cause pain while ambulating in shoes. She is unable to trim her own nails. Patient presents today for further treatment and evaluation.  Past Medical History:  Diagnosis Date  . CAD (coronary artery disease)   . Diabetes (HCC)   . Hip fracture (HCC)   . HTN (hypertension)   . Hypercholesteremia   . Hyperlipidemia   . Hypothyroidism   . Hypothyroidism   . Osteoarthritis   . Osteopenia   . Osteoporosis     Objective:  Physical Exam General: Alert and oriented x3 in no acute distress  Dermatology: Hyperkeratotic lesions present on the bilateral feet x 2. Pain on palpation with a central nucleated core noted. Skin is warm, dry and supple bilateral lower extremities. Negative for open lesions or macerations. Nails are tender, long, thickened and dystrophic with subungual debris, consistent with onychomycosis, 1-5 bilateral. No signs of infection noted.  Vascular: Palpable pedal pulses bilaterally. No edema or erythema noted. Capillary refill within normal limits.  Neurological: Epicritic and protective threshold diminished bilaterally.   Musculoskeletal Exam: Pain on palpation at the keratotic lesion noted. Range of motion within normal limits bilateral. Muscle strength 5/5 in all groups bilateral.  Assessment: 1. Onychodystrophic nails 1-5 bilateral with hyperkeratosis of nails.  2. Onychomycosis of nail due to dermatophyte bilateral 3. Pre-ulcerative callus lesions noted to the bilateral feet x 2   Plan of Care:  1.  Patient evaluated. 2. Excisional debridement of keratoic lesion using a chisel blade was performed without incident.  3. Dressed with light dressing. 4. Mechanical debridement of nails 1-5 bilaterally performed using a nail nipper. Filed with dremel without incident.  5. Patient is to return to the clinic in 6 months.   Felecia ShellingBrent M. Melbourne Jakubiak, DPM [facility]  Dr. Felecia ShellingBrent M. Camden Knotek, DPM    [address]                                        [place], [place] [address]                Office [phone]  Fax [phone]

## 2018-06-14 NOTE — Progress Notes (Deleted)
Cardiology Office Note:    Date:  06/14/2018   ID:  [name], DOB 1926-02-12, MRN [id number]  PCP:  Henrine Screwshacker, Robert, MD  Cardiologist:  No primary care provider on file.   Referring MD: Henrine Screwshacker, Robert, MD   No chief complaint on file. ***  History of Present Illness:    [name] is a 84 y.o. female with a hx of coronary artery disease with prior coronary bypass grafting, hypertension, hyperlipidemia, and diabetes mellitus.Recent atrial flutter which reverted without cardioversion.  Past Medical History:  Diagnosis Date  . Bradycardia 09/21/2013   Heart rate of 48 noted on the EKG, while on beta blocker therapy (metoprolol 75 mg twice a day). Medication dosage decrease was undertaken   . CAD (coronary artery disease)   . 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 f/u with Dr Katrinka BlazingSmith, amlodipine decreased. `E1o3L`discussed with Dr Everlene OtherBouska   . Chronic anticoagulation 05/13/2017  . Coronary artery disease involving coronary bypass graft of native heart with angina pectoris (HCC) 09/21/2013   CABG 1993, LIMA to LAD, SVG to OM2 and 3, SVG to PDA   . Diabetes (HCC)   . Diabetes mellitus, type 2 (HCC) 02/15/2014   Last Assessment & Plan:  Diabetes is improving with treatment.  Continue current treatment regimen. Diabetes will be reassessed in 3 months     The goal is for the Hgb A1C to be less than 7.0.  It is recommended that all diabetics are educated on and follow a healthy diabetic diet, exercise for 30 minutes 3-4 times per week (walking, biking, swimming, or machine), monitor blood glucose readings an  . Essential hypertension, benign 09/21/2013  . H/O deep venous thrombosis 10/23/2012   Overview:  hx of dvt tx 2012 with coumadin following surgery   . Hip fracture (HCC)   . History of cardiovascular surgery 10/23/2012   Overview:  1993. CABG> with LIMA to LAD, sequential SVG to oM, SVG to PDA> followed by Dr. Katrinka BlazingSmith.     Marland Kitchen. HTN (hypertension)   . Hypercholesteremia   . Hyperlipidemia   . Hypothyroidism   . Hypothyroidism   . Osteoarthritis   . Osteopenia   . Osteoporosis   . Paroxysmal atrial flutter 05/13/2017    Past Surgical History:  Procedure Laterality Date  . APPENDECTOMY    . CORONARY ARTERY BYPASS GRAFT     with LIMA to LAD,sequential SVG to OM # 1 & 3, SVG to PDA. All grafts patent with normal LV function 2006, followed by cardiologist Dr. Katrinka BlazingSmith  . EYE SURGERY    . FRACTURE SURGERY    . HEMORRHOID SURGERY    . JOINT REPLACEMENT      Current Medications: No outpatient medications have been marked as taking for the 06/15/18 encounter (Appointment) with Lyn RecordsSmith, Lejon Afzal W, MD.     Allergies:   Ace inhibitors   Social History   Socioeconomic History  . Marital status: Widowed    Spouse name: Not on file  . Number of children: Not on file  . Years of education: Not on file  . Highest education level: Not on file  Occupational History  . Not on file  Social Needs  . Financial resource strain: Not on file  . Food insecurity:    Worry: Not on file    Inability: Not on file  . Transportation needs:    Medical: Not on file    Non-medical: Not on file  Tobacco Use  .  Smoking status: Never Smoker  . Smokeless tobacco: Never Used  Substance and Sexual Activity  . Alcohol use: No    Alcohol/week: 0.0 standard drinks  . Drug use: No  . Sexual activity: Not on file  Lifestyle  . Physical activity:    Days per week: Not on file    Minutes per session: Not on file  . Stress: Not on file  Relationships  . Social connections:    Talks on phone: Not on file    Gets together: Not on file    Attends religious service: Not on file    Active member of club or organization: Not on file    Attends meetings of clubs or organizations: Not on file    Relationship status: Not on file  Other Topics Concern  . Not on file  Social History Narrative  . Not on file     Family History: The  patient's ***family history includes Alzheimer's disease in her brother; Heart disease in her mother; Hyperlipidemia in her mother; Stroke in her mother.  ROS:   Please see the history of present illness.    *** All other systems reviewed and are negative.  EKGs/Labs/Other Studies Reviewed:    The following studies were reviewed today: ***  EKG:  EKG is *** ordered today.  The ekg ordered today demonstrates ***  Recent Labs: No results found for requested labs within last 8760 hours.  Recent Lipid Panel No results found for: CHOL, TRIG, HDL, CHOLHDL, VLDL, LDLCALC, LDLDIRECT  Physical Exam:    VS:  There were no vitals taken for this visit.    Wt Readings from Last 3 Encounters:  03/04/18 132 lb 12.8 oz (60.2 kg)  05/13/17 134 lb (60.8 kg)  02/18/17 135 lb (61.2 kg)     GEN: *** Well nourished, well developed in no acute distress HEENT: Normal NECK: No JVD. LYMPHATICS: No lymphadenopathy CARDIAC: ***RRR, ***murmur, ***gallop, *** edema. VASCULAR: *** pulses. *** bruits. RESPIRATORY:  Clear to auscultation without rales, wheezing or rhonchi  ABDOMEN: Soft, non-tender, non-distended, No pulsatile mass, MUSCULOSKELETAL: No deformity  SKIN: Warm and dry NEUROLOGIC:  Alert and oriented x 3 PSYCHIATRIC:  Normal affect   ASSESSMENT:    1. Coronary artery disease involving coronary bypass graft of native heart with angina pectoris (HCC)   2. Typical atrial flutter (HCC)   3. Other hyperlipidemia   4. Essential hypertension, benign   5. Chronic anticoagulation    PLAN:    In order of problems listed above:  1. ***   Medication Adjustments/Labs and Tests Ordered: Current medicines are reviewed at length with the patient today.  Concerns regarding medicines are outlined above.  No orders of the defined types were placed in this encounter.  No orders of the defined types were placed in this encounter.   There are no Patient Instructions on file for this visit.    Signed, Lesleigh NoeHenry W Yulanda Diggs III, MD  06/14/2018 4:06 PM    [facility]

## 2018-06-15 ENCOUNTER — Ambulatory Visit: Payer: Medicare Other | Admitting: Interventional Cardiology

## 2018-06-16 ENCOUNTER — Encounter: Payer: Self-pay | Admitting: Interventional Cardiology

## 2018-06-25 ENCOUNTER — Encounter

## 2018-06-26 ENCOUNTER — Inpatient Hospital Stay (HOSPITAL_COMMUNITY)
Admission: EM | Admit: 2018-06-26 | Discharge: 2018-07-01 | DRG: 481 | Disposition: A | Discharge status: Home / Self Care | Payer: Medicare Other | Attending: Internal Medicine | Admitting: Internal Medicine

## 2018-06-26 ENCOUNTER — Encounter (HOSPITAL_COMMUNITY): Payer: Self-pay

## 2018-06-26 DIAGNOSIS — S72491A Other fracture of lower end of right femur, initial encounter for closed fracture: Secondary | ICD-10-CM | POA: Diagnosis not present

## 2018-06-26 DIAGNOSIS — F0390 Unspecified dementia without behavioral disturbance: Secondary | ICD-10-CM | POA: Diagnosis present

## 2018-06-26 DIAGNOSIS — I25709 Atherosclerosis of coronary artery bypass graft(s), unspecified, with unspecified angina pectoris: Secondary | ICD-10-CM | POA: Diagnosis present

## 2018-06-26 DIAGNOSIS — S72401A Unspecified fracture of lower end of right femur, initial encounter for closed fracture: Secondary | ICD-10-CM

## 2018-06-26 DIAGNOSIS — Z79899 Other long term (current) drug therapy: Secondary | ICD-10-CM

## 2018-06-26 DIAGNOSIS — R001 Bradycardia, unspecified: Secondary | ICD-10-CM | POA: Diagnosis not present

## 2018-06-26 DIAGNOSIS — H9190 Unspecified hearing loss, unspecified ear: Secondary | ICD-10-CM | POA: Diagnosis present

## 2018-06-26 DIAGNOSIS — Z09 Encounter for follow-up examination after completed treatment for conditions other than malignant neoplasm: Secondary | ICD-10-CM

## 2018-06-26 DIAGNOSIS — I480 Paroxysmal atrial fibrillation: Secondary | ICD-10-CM | POA: Diagnosis present

## 2018-06-26 DIAGNOSIS — Z79890 Hormone replacement therapy (postmenopausal): Secondary | ICD-10-CM

## 2018-06-26 DIAGNOSIS — Z951 Presence of aortocoronary bypass graft: Secondary | ICD-10-CM

## 2018-06-26 DIAGNOSIS — I4581 Long QT syndrome: Secondary | ICD-10-CM | POA: Diagnosis present

## 2018-06-26 DIAGNOSIS — M810 Age-related osteoporosis without current pathological fracture: Secondary | ICD-10-CM | POA: Diagnosis present

## 2018-06-26 DIAGNOSIS — I4892 Unspecified atrial flutter: Secondary | ICD-10-CM | POA: Diagnosis present

## 2018-06-26 DIAGNOSIS — Y92009 Unspecified place in unspecified non-institutional (private) residence as the place of occurrence of the external cause: Secondary | ICD-10-CM

## 2018-06-26 DIAGNOSIS — Z888 Allergy status to other drugs, medicaments and biological substances status: Secondary | ICD-10-CM

## 2018-06-26 DIAGNOSIS — M9711XA Periprosthetic fracture around internal prosthetic right knee joint, initial encounter: Secondary | ICD-10-CM | POA: Diagnosis not present

## 2018-06-26 DIAGNOSIS — Z01811 Encounter for preprocedural respiratory examination: Secondary | ICD-10-CM

## 2018-06-26 DIAGNOSIS — R9431 Abnormal electrocardiogram [ECG] [EKG]: Secondary | ICD-10-CM | POA: Diagnosis present

## 2018-06-26 DIAGNOSIS — E039 Hypothyroidism, unspecified: Secondary | ICD-10-CM | POA: Diagnosis present

## 2018-06-26 DIAGNOSIS — I129 Hypertensive chronic kidney disease with stage 1 through stage 4 chronic kidney disease, or unspecified chronic kidney disease: Secondary | ICD-10-CM | POA: Diagnosis present

## 2018-06-26 DIAGNOSIS — I2510 Atherosclerotic heart disease of native coronary artery without angina pectoris: Secondary | ICD-10-CM | POA: Diagnosis present

## 2018-06-26 DIAGNOSIS — E1122 Type 2 diabetes mellitus with diabetic chronic kidney disease: Secondary | ICD-10-CM | POA: Diagnosis present

## 2018-06-26 DIAGNOSIS — Z66 Do not resuscitate: Secondary | ICD-10-CM | POA: Diagnosis present

## 2018-06-26 DIAGNOSIS — I4891 Unspecified atrial fibrillation: Secondary | ICD-10-CM | POA: Diagnosis present

## 2018-06-26 DIAGNOSIS — W1830XA Fall on same level, unspecified, initial encounter: Secondary | ICD-10-CM | POA: Diagnosis present

## 2018-06-26 DIAGNOSIS — E119 Type 2 diabetes mellitus without complications: Secondary | ICD-10-CM

## 2018-06-26 DIAGNOSIS — Z7982 Long term (current) use of aspirin: Secondary | ICD-10-CM

## 2018-06-26 DIAGNOSIS — E785 Hyperlipidemia, unspecified: Secondary | ICD-10-CM | POA: Diagnosis present

## 2018-06-26 DIAGNOSIS — N183 Chronic kidney disease, stage 3 unspecified: Secondary | ICD-10-CM | POA: Diagnosis present

## 2018-06-26 DIAGNOSIS — S7290XA Unspecified fracture of unspecified femur, initial encounter for closed fracture: Secondary | ICD-10-CM

## 2018-06-26 DIAGNOSIS — I10 Essential (primary) hypertension: Secondary | ICD-10-CM | POA: Diagnosis present

## 2018-06-26 DIAGNOSIS — S7291XA Unspecified fracture of right femur, initial encounter for closed fracture: Secondary | ICD-10-CM

## 2018-06-26 DIAGNOSIS — D62 Acute posthemorrhagic anemia: Secondary | ICD-10-CM | POA: Diagnosis not present

## 2018-06-26 NOTE — ED Triage Notes (Signed)
[facility] reports pt fell in kitchen while talking on the phone to her dtr. Denies hitting head, but injured right knee which has been replaced. Given 25 mg Fentanyl IN and 25 mg IV resulting in 7/10 pain. 203/110 182/90 78 hr 99% RA 202 cbg

## 2018-06-27 ENCOUNTER — Emergency Department (HOSPITAL_COMMUNITY): Payer: Medicare Other

## 2018-06-27 ENCOUNTER — Encounter (HOSPITAL_COMMUNITY): Payer: Self-pay | Admitting: Family Medicine

## 2018-06-27 DIAGNOSIS — I10 Essential (primary) hypertension: Secondary | ICD-10-CM

## 2018-06-27 DIAGNOSIS — I4891 Unspecified atrial fibrillation: Secondary | ICD-10-CM | POA: Diagnosis present

## 2018-06-27 DIAGNOSIS — F05 Delirium due to known physiological condition: Secondary | ICD-10-CM | POA: Diagnosis not present

## 2018-06-27 DIAGNOSIS — R627 Adult failure to thrive: Secondary | ICD-10-CM | POA: Diagnosis not present

## 2018-06-27 DIAGNOSIS — I480 Paroxysmal atrial fibrillation: Secondary | ICD-10-CM | POA: Diagnosis present

## 2018-06-27 DIAGNOSIS — Z888 Allergy status to other drugs, medicaments and biological substances status: Secondary | ICD-10-CM | POA: Diagnosis not present

## 2018-06-27 DIAGNOSIS — N183 Chronic kidney disease, stage 3 unspecified: Secondary | ICD-10-CM | POA: Diagnosis present

## 2018-06-27 DIAGNOSIS — R001 Bradycardia, unspecified: Secondary | ICD-10-CM | POA: Diagnosis not present

## 2018-06-27 DIAGNOSIS — Y92009 Unspecified place in unspecified non-institutional (private) residence as the place of occurrence of the external cause: Secondary | ICD-10-CM | POA: Diagnosis not present

## 2018-06-27 DIAGNOSIS — E785 Hyperlipidemia, unspecified: Secondary | ICD-10-CM | POA: Diagnosis present

## 2018-06-27 DIAGNOSIS — F0390 Unspecified dementia without behavioral disturbance: Secondary | ICD-10-CM | POA: Diagnosis not present

## 2018-06-27 DIAGNOSIS — Z7401 Bed confinement status: Secondary | ICD-10-CM | POA: Diagnosis not present

## 2018-06-27 DIAGNOSIS — W1830XA Fall on same level, unspecified, initial encounter: Secondary | ICD-10-CM | POA: Diagnosis present

## 2018-06-27 DIAGNOSIS — S72401A Unspecified fracture of lower end of right femur, initial encounter for closed fracture: Secondary | ICD-10-CM | POA: Diagnosis present

## 2018-06-27 DIAGNOSIS — M810 Age-related osteoporosis without current pathological fracture: Secondary | ICD-10-CM | POA: Diagnosis present

## 2018-06-27 DIAGNOSIS — I25709 Atherosclerosis of coronary artery bypass graft(s), unspecified, with unspecified angina pectoris: Secondary | ICD-10-CM

## 2018-06-27 DIAGNOSIS — R9431 Abnormal electrocardiogram [ECG] [EKG]: Secondary | ICD-10-CM

## 2018-06-27 DIAGNOSIS — S7291XA Unspecified fracture of right femur, initial encounter for closed fracture: Secondary | ICD-10-CM | POA: Diagnosis not present

## 2018-06-27 DIAGNOSIS — E039 Hypothyroidism, unspecified: Secondary | ICD-10-CM

## 2018-06-27 DIAGNOSIS — E118 Type 2 diabetes mellitus with unspecified complications: Secondary | ICD-10-CM

## 2018-06-27 DIAGNOSIS — S72491A Other fracture of lower end of right femur, initial encounter for closed fracture: Secondary | ICD-10-CM | POA: Diagnosis present

## 2018-06-27 DIAGNOSIS — I2510 Atherosclerotic heart disease of native coronary artery without angina pectoris: Secondary | ICD-10-CM | POA: Diagnosis present

## 2018-06-27 DIAGNOSIS — I483 Typical atrial flutter: Secondary | ICD-10-CM

## 2018-06-27 DIAGNOSIS — M9711XA Periprosthetic fracture around internal prosthetic right knee joint, initial encounter: Secondary | ICD-10-CM | POA: Diagnosis present

## 2018-06-27 DIAGNOSIS — E1122 Type 2 diabetes mellitus with diabetic chronic kidney disease: Secondary | ICD-10-CM | POA: Diagnosis present

## 2018-06-27 DIAGNOSIS — H9190 Unspecified hearing loss, unspecified ear: Secondary | ICD-10-CM | POA: Diagnosis present

## 2018-06-27 DIAGNOSIS — Z951 Presence of aortocoronary bypass graft: Secondary | ICD-10-CM | POA: Diagnosis not present

## 2018-06-27 DIAGNOSIS — D62 Acute posthemorrhagic anemia: Secondary | ICD-10-CM | POA: Diagnosis not present

## 2018-06-27 DIAGNOSIS — Z66 Do not resuscitate: Secondary | ICD-10-CM | POA: Diagnosis present

## 2018-06-27 DIAGNOSIS — Z79899 Other long term (current) drug therapy: Secondary | ICD-10-CM | POA: Diagnosis not present

## 2018-06-27 DIAGNOSIS — I129 Hypertensive chronic kidney disease with stage 1 through stage 4 chronic kidney disease, or unspecified chronic kidney disease: Secondary | ICD-10-CM | POA: Diagnosis present

## 2018-06-27 DIAGNOSIS — Z79890 Hormone replacement therapy (postmenopausal): Secondary | ICD-10-CM | POA: Diagnosis not present

## 2018-06-27 DIAGNOSIS — Z7982 Long term (current) use of aspirin: Secondary | ICD-10-CM | POA: Diagnosis not present

## 2018-06-27 DIAGNOSIS — I4892 Unspecified atrial flutter: Secondary | ICD-10-CM | POA: Diagnosis present

## 2018-06-27 DIAGNOSIS — I4581 Long QT syndrome: Secondary | ICD-10-CM | POA: Diagnosis present

## 2018-06-27 DIAGNOSIS — M2550 Pain in unspecified joint: Secondary | ICD-10-CM | POA: Diagnosis not present

## 2018-06-27 LAB — GLUCOSE, CAPILLARY
Glucose-Capillary: 171 mg/dL — ABNORMAL HIGH (ref 70–99)
Glucose-Capillary: 146 mg/dL — ABNORMAL HIGH (ref 70–99)
Glucose-Capillary: 161 mg/dL — ABNORMAL HIGH (ref 70–99)
Glucose-Capillary: 232 mg/dL — ABNORMAL HIGH (ref 70–99)

## 2018-06-27 LAB — BASIC METABOLIC PANEL
Anion gap: 12 (ref 5–15)
BUN: 34 mg/dL — ABNORMAL HIGH (ref 8–23)
CO2: 25 mmol/L (ref 22–32)
Calcium: 9.2 mg/dL (ref 8.9–10.3)
Chloride: 105 mmol/L (ref 98–111)
Creatinine, Ser: 1.16 mg/dL — ABNORMAL HIGH (ref 0.44–1.00)
GFR calc Af Amer: 47 mL/min — ABNORMAL LOW (ref >60)
GFR calc non Af Amer: 40 mL/min — ABNORMAL LOW (ref >60)
Glucose, Bld: 191 mg/dL — ABNORMAL HIGH (ref 70–99)
Potassium: 3.9 mmol/L (ref 3.5–5.1)
Sodium: 142 mmol/L (ref 135–145)

## 2018-06-27 LAB — CBC WITH DIFFERENTIAL/PLATELET
Abs Immature Granulocytes: 0.1 10*3/uL (ref 0.0–0.1)
Basophils Absolute: 0 10*3/uL (ref 0.0–0.1)
Basophils Relative: 0 %
Eosinophils Absolute: 0 10*3/uL (ref 0.0–0.7)
Eosinophils Relative: 0 %
HCT: 38.8 % (ref 36.0–46.0)
Hemoglobin: 12.5 g/dL (ref 12.0–15.0)
Immature Granulocytes: 1 %
Lymphocytes Relative: 12 %
Lymphs Abs: 1.5 10*3/uL (ref 0.7–4.0)
MCH: 30 pg (ref 26.0–34.0)
MCHC: 32.2 g/dL (ref 30.0–36.0)
MCV: 93.3 fL (ref 78.0–100.0)
Monocytes Absolute: 0.8 10*3/uL (ref 0.1–1.0)
Monocytes Relative: 6 %
Neutro Abs: 10.5 10*3/uL — ABNORMAL HIGH (ref 1.7–7.7)
Neutrophils Relative %: 81 %
Platelets: 193 10*3/uL (ref 150–400)
RBC: 4.16 MIL/uL (ref 3.87–5.11)
RDW: 14.5 % (ref 11.5–15.5)
WBC: 13 10*3/uL — ABNORMAL HIGH (ref 4.0–10.5)

## 2018-06-27 LAB — TYPE AND SCREEN
ABO/RH(D): O POS
Antibody Screen: NEGATIVE

## 2018-06-27 LAB — TSH: TSH: 27.292 u[IU]/mL — ABNORMAL HIGH (ref 0.350–4.500)

## 2018-06-27 LAB — PROTIME-INR
INR: 1.05
Prothrombin Time: 13.6 seconds (ref 11.4–15.2)

## 2018-06-27 LAB — CBG MONITORING, ED: Glucose-Capillary: 180 mg/dL — ABNORMAL HIGH (ref 70–99)

## 2018-06-27 MED ORDER — INSULIN ASPART 100 UNIT/ML ~~LOC~~ SOLN
0.0000 [IU] | SUBCUTANEOUS | Status: DC
  Administered 2018-06-27: 1 [IU] via SUBCUTANEOUS
  Administered 2018-06-27 (×2): 2 [IU] via SUBCUTANEOUS
  Administered 2018-06-27: 3 [IU] via SUBCUTANEOUS
  Administered 2018-06-28 (×2): 2 [IU] via SUBCUTANEOUS
  Administered 2018-06-28: 1 [IU] via SUBCUTANEOUS
  Administered 2018-06-28: 2 [IU] via SUBCUTANEOUS
  Administered 2018-06-29: 3 [IU] via SUBCUTANEOUS
  Administered 2018-06-29 (×3): 2 [IU] via SUBCUTANEOUS
  Administered 2018-06-29 – 2018-07-01 (×5): 1 [IU] via SUBCUTANEOUS
  Administered 2018-07-01: 2 [IU] via SUBCUTANEOUS

## 2018-06-27 MED ORDER — SENNOSIDES-DOCUSATE SODIUM 8.6-50 MG PO TABS: 1.0000 | ORAL_TABLET | Freq: Every evening | ORAL | Status: DC | PRN

## 2018-06-27 MED ORDER — METOPROLOL TARTRATE 12.5 MG HALF TABLET
12.5000 mg | ORAL_TABLET | Freq: Two times a day (BID) | ORAL | Status: DC
  Administered 2018-06-27 – 2018-07-01 (×7): 12.5 mg via ORAL
  Filled 2018-06-27 (×8): qty 1

## 2018-06-27 MED ORDER — LORAZEPAM 2 MG/ML IJ SOLN
0.2500 mg | Freq: Once | INTRAMUSCULAR | Status: AC
  Administered 2018-06-27: 0.25 mg via INTRAVENOUS
  Filled 2018-06-27: qty 1

## 2018-06-27 MED ORDER — SIMVASTATIN 10 MG PO TABS
10.0000 mg | ORAL_TABLET | Freq: Every day | ORAL | Status: DC
  Administered 2018-06-28 – 2018-06-30 (×3): 10 mg via ORAL
  Filled 2018-06-27 (×3): qty 1

## 2018-06-27 MED ORDER — POTASSIUM CHLORIDE IN NACL 20-0.9 MEQ/L-% IV SOLN
INTRAVENOUS | Status: DC
  Administered 2018-06-27: 06:00:00 via INTRAVENOUS
  Filled 2018-06-27: qty 1000

## 2018-06-27 MED ORDER — MORPHINE SULFATE (PF) 2 MG/ML IV SOLN
0.5000 mg | INTRAVENOUS | Status: DC | PRN
  Administered 2018-06-27 – 2018-06-28 (×3): 1 mg via INTRAVENOUS
  Filled 2018-06-27 (×3): qty 1

## 2018-06-27 MED ORDER — AMLODIPINE BESYLATE 2.5 MG PO TABS
2.5000 mg | ORAL_TABLET | Freq: Every day | ORAL | Status: DC
  Administered 2018-06-27 – 2018-06-29 (×2): 2.5 mg via ORAL
  Filled 2018-06-27 (×2): qty 1

## 2018-06-27 MED ORDER — MAGNESIUM SULFATE 2 GM/50ML IV SOLN
2.0000 g | Freq: Once | INTRAVENOUS | Status: AC
  Administered 2018-06-27: 2 g via INTRAVENOUS
  Filled 2018-06-27: qty 50

## 2018-06-27 MED ORDER — METHOCARBAMOL 1000 MG/10ML IJ SOLN
500.0000 mg | Freq: Four times a day (QID) | INTRAVENOUS | Status: DC | PRN
  Filled 2018-06-27: qty 5

## 2018-06-27 MED ORDER — ENSURE SURGERY PO LIQD
237.0000 mL | Freq: Two times a day (BID) | ORAL | Status: DC
  Administered 2018-06-27 – 2018-07-01 (×6): 237 mL via ORAL
  Filled 2018-06-27 (×9): qty 237

## 2018-06-27 MED ORDER — SODIUM CHLORIDE 0.9 % IV SOLN
INTRAVENOUS | Status: DC
  Administered 2018-06-27 – 2018-06-29 (×2): via INTRAVENOUS

## 2018-06-27 MED ORDER — HYDRALAZINE HCL 20 MG/ML IJ SOLN: 10.0000 mg | Freq: Three times a day (TID) | INTRAMUSCULAR | Status: DC | PRN

## 2018-06-27 MED ORDER — LEVOTHYROXINE SODIUM 100 MCG PO TABS
100.0000 ug | ORAL_TABLET | Freq: Every day | ORAL | Status: DC
  Administered 2018-06-27: 100 ug via ORAL
  Filled 2018-06-27: qty 1

## 2018-06-27 MED ORDER — METHOCARBAMOL 500 MG PO TABS
500.0000 mg | ORAL_TABLET | Freq: Four times a day (QID) | ORAL | Status: DC | PRN
  Administered 2018-06-27 – 2018-06-29 (×2): 500 mg via ORAL
  Filled 2018-06-27 (×2): qty 1

## 2018-06-27 NOTE — ED Provider Notes (Signed)
[facility] Provider Note   CSN: [id number] Arrival date & time: 06/26/18  2305     History   Chief Complaint Chief Complaint  Patient presents with  . Fall    HPI [name] is a 84 y.o. female.  HPI 84 year old female past medical history significant for diabetes, hypertension, CAD, osteopenia presents to the emergency department today for evaluation of right knee pain after mechanical injury.  Patient was at home talking to her daughter on the phone when she made a sudden twisting motion and lost her balance falling and hitting her right knee.  Patient reports pain just proximal to the right knee.  She has not been ambulatory since the event.  Was given fentanyl in route by EMS which helped her pain.  Denies any paresthesias or weakness.  Denies any head injury or LOC.  Patient denies being on blood thinners.  Pain is worse with palpation and range of motion.  Denies any abdominal pain, chest pain, right hip pain, neck pain or back pain. Past Medical History:  Diagnosis Date  . Bradycardia 09/21/2013   Heart rate of 48 noted on the EKG, while on beta blocker therapy (metoprolol 75 mg twice a day). Medication dosage decrease was undertaken   . CAD (coronary artery disease)   . 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 f/u with Dr Katrinka Blazing, amlodipine decreased. `E1o3L`discussed with Dr Everlene Other   . Chronic anticoagulation 05/13/2017  . Coronary artery disease involving coronary bypass graft of native heart with angina pectoris (HCC) 09/21/2013   CABG 1993, LIMA to LAD, SVG to OM2 and 3, SVG to PDA   . Diabetes (HCC)   . Diabetes mellitus, type 2 (HCC) 02/15/2014   Last Assessment & Plan:  Diabetes is improving with treatment.  Continue current treatment regimen. Diabetes will be reassessed in 3 months     The goal is for the Hgb A1C to be less than 7.0.  It is recommended that all diabetics  are educated on and follow a healthy diabetic diet, exercise for 30 minutes 3-4 times per week (walking, biking, swimming, or machine), monitor blood glucose readings an  . Essential hypertension, benign 09/21/2013  . H/O deep venous thrombosis 10/23/2012   Overview:  hx of dvt tx 2012 with coumadin following surgery   . Hip fracture (HCC)   . History of cardiovascular surgery 10/23/2012   Overview:  1993. CABG> with LIMA to LAD, sequential SVG to oM, SVG to PDA> followed by Dr. Katrinka Blazing.   Marland Kitchen HTN (hypertension)   . Hypercholesteremia   . Hyperlipidemia   . Hypothyroidism   . Hypothyroidism   . Osteoarthritis   . Osteopenia   . Osteoporosis   . Paroxysmal atrial flutter 05/13/2017    Patient Active Problem List   Diagnosis Date Noted  . Closed fracture of distal end of right femur (HCC) 06/27/2018  . CKD (chronic kidney disease), stage III (HCC) 06/27/2018  . Prolonged QT interval 06/27/2018  . Closed fracture of right femur, initial encounter (HCC) 06/27/2018  . Paroxysmal atrial flutter 05/13/2017  . Diabetes mellitus, type 2 (HCC) 02/15/2014  . Difficulty hearing 02/14/2014  . Problems influencing health status 02/14/2014  . BMI (body mass index), pediatric, 5% to less than 85% for age 72/20/2015  . Coronary artery disease involving coronary bypass graft of native heart with angina pectoris (HCC) 09/21/2013    Class: Chronic  . Essential hypertension, benign  09/21/2013  . Hyperlipidemia 09/21/2013  . Bradycardia 09/21/2013    Class: Chronic  . Adult hypothyroidism 08/17/2013  . Routine general medical examination at a health care facility 08/28/2012    Past Surgical History:  Procedure Laterality Date  . APPENDECTOMY    . CORONARY ARTERY BYPASS GRAFT     with LIMA to LAD,sequential SVG to OM # 1 & 3, SVG to PDA. All grafts patent with normal LV function 2006, followed by cardiologist Dr. Katrinka Blazing  . EYE SURGERY    . FRACTURE SURGERY    . HEMORRHOID SURGERY    . JOINT  REPLACEMENT       OB History   None      Home Medications    Prior to Admission medications   Medication Sig Start Date End Date Taking? Authorizing Provider  aspirin EC 81 MG tablet Take 1 tablet (81 mg total) by mouth daily. 03/05/18  Yes Lyn Records, MD  amLODipine (NORVASC) 2.5 MG tablet Take 2.5 mg by mouth daily.    [provider]  calcium carbonate 1250 MG capsule Take 1,200 mg by mouth daily.     [provider]  CINNAMON PO Take 1 tablet by mouth daily.    [provider]  Cyanocobalamin (VITAMIN B 12 PO) Take 1,000 mg by mouth daily.    [provider]  Flaxseed, Linseed, (FLAX SEED OIL) 1000 MG CAPS Take 1 capsule by mouth daily.    [provider]  folic acid (FOLVITE) 400 MCG tablet Take 400 mcg by mouth daily.    [provider]  hydrochlorothiazide (HYDRODIURIL) 25 MG tablet Take 25 mg by mouth daily.    [provider]  levothyroxine (SYNTHROID, LEVOTHROID) 100 MCG tablet Take 100 mcg by mouth daily before breakfast.    [provider]  MAGNESIUM PO Take 1 tablet by mouth daily.    [provider]  metoprolol tartrate (LOPRESSOR) 25 MG tablet Take 0.5 tablets (12.5 mg total) by mouth 2 (two) times daily. 05/27/17 08/25/17  Lyn Records, MD  Multiple Vitamins-Minerals (CENTRUM SILVER PO) Take 1 tablet by mouth daily.    [provider]  Omega-3 1000 MG CAPS Take by mouth.    [provider]  pyridOXINE (VITAMIN B-6) 100 MG tablet Take 100 mg by mouth daily.    [provider]  simvastatin (ZOCOR) 10 MG tablet Take 10 mg by mouth daily. 09/11/15   [provider]    Family History Family History  Problem Relation Age of Onset  . Heart disease Mother   . Hyperlipidemia Mother   . Stroke Mother   . Alzheimer's disease Brother     Social History Social History   Tobacco Use  . Smoking status: Never Smoker  . Smokeless tobacco: Never Used    Substance Use Topics  . Alcohol use: No    Alcohol/week: 0.0 standard drinks  . Drug use: No     Allergies   Ace inhibitors   Review of Systems Review of Systems  All other systems reviewed and are negative.    Physical Exam Updated Vital Signs BP (!) 188/73   Pulse 63   Temp 97.8 F (36.6 C) (Axillary)   Resp 14   Ht 5\' 2"  (1.575 m)   Wt 58.5 kg   SpO2 98%   BMI 23.59 kg/m   Physical Exam  Constitutional: She is oriented to person, place, and time. She appears well-developed and well-nourished. No distress.  HENT:  Head: Normocephalic and atraumatic.  No bilateral hemotympanum.  No septal hematoma.  No skull depression.  No raccoon eyes or battle sign.  Eyes: Pupils are equal, round, and reactive to light. Conjunctivae are normal. Right eye exhibits no discharge. Left eye exhibits no discharge. No scleral icterus.  Neck: Normal range of motion. Neck supple.  No c spine midline tenderness. No paraspinal tenderness. No deformities or step offs noted. Full ROM. Supple. No nuchal rigidity.    Cardiovascular: Normal rate, regular rhythm, normal heart sounds and intact distal pulses.  Pulmonary/Chest: Effort normal and breath sounds normal. No respiratory distress.  Abdominal: Soft. There is no tenderness.  No ecchymosis noted.  Musculoskeletal: Normal range of motion.       Legs: No midline T spine or L spine tenderness. No deformities or step offs noted. Full ROM. Pelvis is stable.  Limited range of motion of the right knee secondary to pain.  She does have some shortening and rotation of the right lower extremity.  There is no obvious deformity on my examination.  Skin compartments are soft.  DP pulses are 2+ bilaterally.  Sensation intact.  Brisk cap refill.  Able to move the right toes and right ankle without pain.  Neurological: She is alert and oriented to person, place, and time.  Skin: Skin is warm and dry. Capillary refill takes less than 2 seconds. No pallor.   Psychiatric: Her behavior is normal. Judgment and thought content normal.  Nursing note and vitals reviewed.    ED Treatments / Results  Labs (all labs ordered are listed, but only abnormal results are displayed) Labs Reviewed  CBC WITH DIFFERENTIAL/PLATELET - Abnormal; Notable for the following components:      Result Value   WBC 13.0 (*)    Neutro Abs 10.5 (*)    All other components within normal limits  BASIC METABOLIC PANEL - Abnormal; Notable for the following components:   Glucose, Bld 191 (*)    BUN 34 (*)    Creatinine, Ser 1.16 (*)    GFR calc non Af Amer 40 (*)    GFR calc Af Amer 47 (*)    All other components within normal limits  GLUCOSE, CAPILLARY - Abnormal; Notable for the following components:   Glucose-Capillary 171 (*)    All other components within normal limits  CBG MONITORING, ED - Abnormal; Notable for the following components:   Glucose-Capillary 180 (*)    All other components within normal limits  TSH  TYPE AND SCREEN    EKG EKG Interpretation  Date/Time:  Saturday June 27 2018 02:47:53 EDT Ventricular Rate:  74 PR Interval:    QRS Duration: 104 QT Interval:  522 QTC Calculation: 580 R Axis:   71 Text Interpretation:  Sinus rhythm Nonspecific T abnormalities, diffuse leads Prolonged QT interval Confirmed by Geoffery Lyons ([id number]) on 06/27/2018 3:47:18 AM   Radiology Dg Chest 1 View  Result Date: 06/27/2018 CLINICAL DATA:  Patient status post fall. EXAM: CHEST  1 VIEW COMPARISON:  Chest radiograph 06/23/2011 FINDINGS: Stable enlarged cardiac and mediastinal contours status post median sternotomy. No consolidative pulmonary opacities. No pleural effusion or pneumothorax. Thoracic spine degenerative changes. IMPRESSION: No acute cardiopulmonary process.  Cardiomegaly. Electronically Signed   By: Annia Belt M.D.   On: 06/27/2018 01:04   Ct Head Wo Contrast  Result Date: 06/27/2018 CLINICAL DATA:  84 year old female with C-spine trauma.  EXAM: CT HEAD WITHOUT CONTRAST CT CERVICAL SPINE WITHOUT CONTRAST TECHNIQUE: Multidetector CT imaging of the  head and cervical spine was performed following the standard protocol without intravenous contrast. Multiplanar CT image reconstructions of the cervical spine were also generated. COMPARISON:  None. FINDINGS: CT HEAD FINDINGS Brain: There is mild age-related atrophy and chronic microvascular ischemic changes. There is no acute intracranial hemorrhage. No mass effect or midline shift. No extra-axial fluid collection. Vascular: No hyperdense vessel or unexpected calcification. Skull: Normal. Negative for fracture or focal lesion. Sinuses/Orbits: There is opacification of the right sphenoid sinus. The remainder of the visualized paranasal sinuses and mastoid air cells are clear. Other: None CT CERVICAL SPINE FINDINGS Alignment: No acute subluxation. There is grade 1 C4-C5 anterolisthesis. Skull base and vertebrae: No acute fracture. The bones are osteopenic. Soft tissues and spinal canal: No prevertebral fluid or swelling. No visible canal hematoma. Disc levels:  Degenerative changes primarily at C5-C6 and C6-C7. Upper chest: Negative. Other: Partially visualized median sternotomy wires. Bilateral carotid bulb calcified plaques. IMPRESSION: 1. No acute intracranial hemorrhage. 2. No acute/traumatic cervical pathology. Electronically Signed   By: Elgie CollardArash  Radparvar M.D.   On: 06/27/2018 01:24   Ct Cervical Spine Wo Contrast  Result Date: 06/27/2018 CLINICAL DATA:  84 year old female with C-spine trauma. EXAM: CT HEAD WITHOUT CONTRAST CT CERVICAL SPINE WITHOUT CONTRAST TECHNIQUE: Multidetector CT imaging of the head and cervical spine was performed following the standard protocol without intravenous contrast. Multiplanar CT image reconstructions of the cervical spine were also generated. COMPARISON:  None. FINDINGS: CT HEAD FINDINGS Brain: There is mild age-related atrophy and chronic microvascular ischemic  changes. There is no acute intracranial hemorrhage. No mass effect or midline shift. No extra-axial fluid collection. Vascular: No hyperdense vessel or unexpected calcification. Skull: Normal. Negative for fracture or focal lesion. Sinuses/Orbits: There is opacification of the right sphenoid sinus. The remainder of the visualized paranasal sinuses and mastoid air cells are clear. Other: None CT CERVICAL SPINE FINDINGS Alignment: No acute subluxation. There is grade 1 C4-C5 anterolisthesis. Skull base and vertebrae: No acute fracture. The bones are osteopenic. Soft tissues and spinal canal: No prevertebral fluid or swelling. No visible canal hematoma. Disc levels:  Degenerative changes primarily at C5-C6 and C6-C7. Upper chest: Negative. Other: Partially visualized median sternotomy wires. Bilateral carotid bulb calcified plaques. IMPRESSION: 1. No acute intracranial hemorrhage. 2. No acute/traumatic cervical pathology. Electronically Signed   By: Elgie CollardArash  Radparvar M.D.   On: 06/27/2018 01:24   Ct Knee Right Wo Contrast  Result Date: 06/27/2018 CLINICAL DATA:  84 year old female with trauma to the right lower extremity. EXAM: CT OF THE right KNEE WITHOUT CONTRAST TECHNIQUE: Multidetector CT imaging of the right knee was performed according to the standard protocol. Multiplanar CT image reconstructions were also generated. COMPARISON:  Earlier radiograph dated 06/27/2018 FINDINGS: Bones/Joint/Cartilage There is a displaced and comminuted spiral fracture of the mid to distal femoral diaphysis extending to the distal femoral metaphysis and the superior portion of the femoral component of the knee arthroplasty. There is a partially visualized right femoral intramedullary rod. The fracture extends along the visualized portion of the intramedullary rod. Comminuted and displaced fractures of the distal femoral metadiaphysis. The knee arthroplasty appears in anatomic alignment with limited evaluation due to streak  artifact. There is no dislocation. There is a small to moderate suprapatellar effusion or hematoma. Ligaments Suboptimally assessed by CT. Muscles and Tendons Probable edema or intramuscular hematoma involving the visualized portion of the distal quadriceps musculature. Soft tissues Diffuse stranding of the subcutaneous fat as well as stranding of the fat in the posterior knee compartment.  IMPRESSION: Displaced comminuted fracture of the mid to distal right femur as described. Electronically Signed   By: Elgie Collard M.D.   On: 06/27/2018 04:59   Dg Knee Complete 4 Views Right  Result Date: 06/27/2018 CLINICAL DATA:  84 year old female with fall and trauma to the right knee. EXAM: RIGHT KNEE - COMPLETE 4+ VIEW COMPARISON:  Right knee radiograph dated 03/26/2005 FINDINGS: Partially visualized femoral intramedullary fixation rod and distal screws. There is a comminuted spiral fracture of the mid to distal femur with approximately 7 mm distraction gap and medial displacement of the distal fracture fragment. The fracture extends to the distal intramedullary fixation rod and also extends to the distal femoral metadiaphysis above the femoral component of the knee arthroplasty. There is a total knee arthroplasty which appears intact and in anatomic alignment. The bones are osteopenic. Multiple surgical clips noted in the soft tissues of the medial aspect of the knee. Vascular calcification also noted. Small suprapatellar effusion. IMPRESSION: 1. Comminuted, mildly displaced spiral fracture of the mid to distal femur extending to the distal femoral metadiaphysis above the femoral component of the knee arthroplasty. 2. Partially visualized right femoral intramedullary fixation rod as well as total right knee arthroplasty. Electronically Signed   By: Elgie Collard M.D.   On: 06/27/2018 01:07   Dg Hip Unilat W Or Wo Pelvis 2-3 Views Right  Result Date: 06/27/2018 CLINICAL DATA:  Larey Seat in kitchen.  RIGHT knee  injury. EXAM: DG HIP (WITH OR WITHOUT PELVIS) 2-3V RIGHT COMPARISON:  None. FINDINGS: RIGHT femoral neck pinning with intramedullary rod, partially imaged distal femur fracture seen on single view. Extensive heterotopic calcific calcification proximal femur with cerclage wire. Osteopenia. No destructive bony lesions. No dislocation. Moderate vascular calcifications. Phleboliths project in the pelvis. IMPRESSION: Partially imaged acute distal femur fracture, recommend dedicated RIGHT femur radiograph. Electronically Signed   By: Awilda Metro M.D.   On: 06/27/2018 01:02    Procedures Procedures (including critical care time)  Medications Ordered in ED Medications  amLODipine (NORVASC) tablet 2.5 mg (has no administration in time range)  metoprolol tartrate (LOPRESSOR) tablet 12.5 mg (has no administration in time range)  simvastatin (ZOCOR) tablet 10 mg (has no administration in time range)  levothyroxine (SYNTHROID, LEVOTHROID) tablet 100 mcg (has no administration in time range)  morphine 2 MG/ML injection 0.5-1 mg (has no administration in time range)  insulin aspart (novoLOG) injection 0-9 Units (2 Units Subcutaneous Given 06/27/18 0543)  0.9 % NaCl with KCl 20 mEq/ L  infusion ( Intravenous Rate/Dose Verify 06/27/18 0544)  methocarbamol (ROBAXIN) tablet 500 mg (500 mg Oral Given 06/27/18 0600)    Or  methocarbamol (ROBAXIN) 500 mg in dextrose 5 % 50 mL IVPB ( Intravenous See Alternative 06/27/18 0600)  senna-docusate (Senokot-S) tablet 1 tablet (has no administration in time range)  magnesium sulfate IVPB 2 g 50 mL ( Intravenous Stopped 06/27/18 0510)     Initial Impression / Assessment and Plan / ED Course  I have reviewed the triage vital signs and the nursing notes.  Pertinent labs & imaging results that were available during my care of the patient were reviewed by me and considered in my medical decision making (see chart for details).     She presents to the ED with right lower  leg pain after mechanical fall prior to arrival.  Patient neurovascularly intact.  Denies head injury or LOC.  No signs of intracranial, intrathoracic or intra-abdominal trauma.  CT scans of head and neck were reassuring.  X-ray of the right femur reveals a comminuted and slightly displaced spiral distal femur fracture.  Spoke with Dr. Everardo Pacific with orthopedics concerning fracture.  He has recommended hospital admission and patient to be n.p.o. has recommended CT scan of the right knee.  Basic lab work was obtained along with preop chest x-ray and EKG.  Labs reassuring appear to be at patient's baseline.  EKG shows normal sinus rhythm with slightly prolonged QT but no other acute findings.  Chest x-ray reassuring.  Patient was admitted to Dr. Antionette Char.  Was updated on plan of care.  Remains hemodynamic stable.  Final Clinical Impressions(s) / ED Diagnoses   Final diagnoses:  Closed fracture of right femur, unspecified fracture morphology, unspecified portion of femur, initial encounter [facility])    ED Discharge Orders    None       Rise Mu, PA-C 06/27/18 0700    Geoffery Lyons, MD 06/27/18 [phone]

## 2018-06-27 NOTE — Progress Notes (Signed)
Patient seen and examined.  She is anxious. Complaining of leg pain.  CVS; S 1, S 2 RRR Lungs CTA Extremity immobilizer.   1-Right femur fracture;  Or tomorrow.   A fib;   Prolong Qt  Repeat EKG in am.   Hypothyroidism; continue with synthroid.   DM; SSI.   CKD stage III;  Monitor Brittney BarefootBelkys [name] Louk, MD.

## 2018-06-27 NOTE — Consult Note (Signed)
ORTHOPAEDIC CONSULTATION  REQUESTING PHYSICIAN: Elmarie Shiley, MD  Chief Complaint: R femur fracture  HPI: [name] is a 84 y.o. female with  Fall resulting in right femur fracture.  Patient had mechanical fall last night and walks with walker at baseline.  No other areas of pain.  Patient cleared for surgery per medical team last night.  Pain is moderate to severe but not as significant if she is laying still.   Past Medical History:  Diagnosis Date  . Bradycardia 09/21/2013   Heart rate of 48 noted on the EKG, while on beta blocker therapy (metoprolol 75 mg twice a day). Medication dosage decrease was undertaken   . CAD (coronary artery disease)   . 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 f/u with Dr Tamala Julian, amlodipine decreased. `E1o3L`discussed with Dr Coletta Memos   . Chronic anticoagulation 05/13/2017  . Coronary artery disease involving coronary bypass graft of native heart with angina pectoris ([place]) 09/21/2013   CABG 1993, LIMA to LAD, SVG to OM2 and 3, SVG to PDA   . Diabetes ([place])   . Diabetes mellitus, type 2 ([place]) 02/15/2014   Last Assessment & Plan:  Diabetes is improving with treatment.  Continue current treatment regimen. Diabetes will be reassessed in 3 months     The goal is for the Hgb A1C to be less than 7.0.  It is recommended that all diabetics are educated on and follow a healthy diabetic diet, exercise for 30 minutes 3-4 times per week (walking, biking, swimming, or machine), monitor blood glucose readings an  . Essential hypertension, benign 09/21/2013  . H/O deep venous thrombosis 10/23/2012   Overview:  hx of dvt tx 2012 with coumadin following surgery   . Hip fracture ([place])   . History of cardiovascular surgery 10/23/2012   Overview:  1993. CABG> with LIMA to LAD, sequential SVG to oM, SVG to PDA> followed by Dr. Tamala Julian.   Mar[place] Kitchen HTN (hypertension)   . Hypercholesteremia   . Hyperlipidemia   .  Hypothyroidism   . Hypothyroidism   . Osteoarthritis   . Osteopenia   . Osteoporosis   . Paroxysmal atrial flutter 05/13/2017   Past Surgical History:  Procedure Laterality Date  . APPENDECTOMY    . CORONARY ARTERY BYPASS GRAFT     with LIMA to LAD,sequential SVG to OM # 1 & 3, SVG to PDA. All grafts patent with normal LV function 2006, followed by cardiologist Dr. Tamala Julian  . EYE SURGERY    . FRACTURE SURGERY    . HEMORRHOID SURGERY    . JOINT REPLACEMENT     Social History   Socioeconomic History  . Marital status: Widowed    Spouse name: Not on file  . Number of children: Not on file  . Years of education: Not on file  . Highest education level: Not on file  Occupational History  . Not on file  Social Needs  . Financial resource strain: Not on file  . Food insecurity:    Worry: Not on file    Inability: Not on file  . Transportation needs:    Medical: Not on file    Non-medical: Not on file  Tobacco Use  . Smoking status: Never Smoker  . Smokeless tobacco: Never Used  Substance and Sexual Activity  . Alcohol use: No    Alcohol/week: 0.0 standard drinks  . Drug use: No  . Sexual activity: Not on file  Lifestyle  . Physical activity:    Days per week: Not on file    Minutes per session: Not on file  . Stress: Not on file  Relationships  . Social connections:    Talks on phone: Not on file    Gets together: Not on file    Attends religious service: Not on file    Active member of club or organization: Not on file    Attends meetings of clubs or organizations: Not on file    Relationship status: Not on file  Other Topics Concern  . Not on file  Social History Narrative  . Not on file   Family History  Problem Relation Age of Onset  . Heart disease Mother   . Hyperlipidemia Mother   . Stroke Mother   . Alzheimer's disease Brother    Allergies  Allergen Reactions  . Ace Inhibitors     Other reaction(s): Other (See Comments) cough   Prior to Admission  medications   Medication Sig Start Date End Date Taking? Authorizing Provider  aspirin EC 81 MG tablet Take 1 tablet (81 mg total) by mouth daily. 03/05/18  Yes Belva Crome, MD  amLODipine (NORVASC) 2.5 MG tablet Take 2.5 mg by mouth daily.    [provider]  calcium carbonate 1250 MG capsule Take 1,200 mg by mouth daily.     [provider]  CINNAMON PO Take 1 tablet by mouth daily.    [provider]  Cyanocobalamin (VITAMIN B 12 PO) Take 1,000 mg by mouth daily.    [provider]  Flaxseed, Linseed, (FLAX SEED OIL) 1000 MG CAPS Take 1 capsule by mouth daily.    [provider]  folic acid (FOLVITE) 628 MCG tablet Take 400 mcg by mouth daily.    [provider]  hydrochlorothiazide (HYDRODIURIL) 25 MG tablet Take 25 mg by mouth daily.    [provider]  levothyroxine (SYNTHROID, LEVOTHROID) 100 MCG tablet Take 100 mcg by mouth daily before breakfast.    [provider]  MAGNESIUM PO Take 1 tablet by mouth daily.    [provider]  metoprolol tartrate (LOPRESSOR) 25 MG tablet Take 0.5 tablets (12.5 mg total) by mouth 2 (two) times daily. 05/27/17 08/25/17  Belva Crome, MD  Multiple Vitamins-Minerals (CENTRUM SILVER PO) Take 1 tablet by mouth daily.    [provider]  Omega-3 1000 MG CAPS Take by mouth.    [provider]  pyridOXINE (VITAMIN B-6) 100 MG tablet Take 100 mg by mouth daily.    [provider]  simvastatin (ZOCOR) 10 MG tablet Take 10 mg by mouth daily. 09/11/15   [provider]   Dg Chest 1 View  Result Date: 06/27/2018 CLINICAL DATA:  Patient status post fall. EXAM: CHEST  1 VIEW COMPARISON:  Chest radiograph 06/23/2011 FINDINGS: Stable enlarged cardiac and mediastinal contours status post median sternotomy. No consolidative pulmonary opacities. No pleural effusion or pneumothorax. Thoracic spine degenerative changes. IMPRESSION: No acute cardiopulmonary  process.  Cardiomegaly. Electronically Signed   By: Lovey Newcomer M.D.   On: 06/27/2018 01:04   Ct Head Wo Contrast  Result Date: 06/27/2018 CLINICAL DATA:  84 year old female with C-spine trauma. EXAM: CT HEAD WITHOUT CONTRAST CT CERVICAL SPINE WITHOUT CONTRAST TECHNIQUE: Multidetector CT imaging of the head and cervical spine was performed following the standard protocol without intravenous contrast. Multiplanar CT image reconstructions of the cervical spine were also generated. COMPARISON:  None. FINDINGS: CT HEAD FINDINGS  Brain: There is mild age-related atrophy and chronic microvascular ischemic changes. There is no acute intracranial hemorrhage. No mass effect or midline shift. No extra-axial fluid collection. Vascular: No hyperdense vessel or unexpected calcification. Skull: Normal. Negative for fracture or focal lesion. Sinuses/Orbits: There is opacification of the right sphenoid sinus. The remainder of the visualized paranasal sinuses and mastoid air cells are clear. Other: None CT CERVICAL SPINE FINDINGS Alignment: No acute subluxation. There is grade 1 C4-C5 anterolisthesis. Skull base and vertebrae: No acute fracture. The bones are osteopenic. Soft tissues and spinal canal: No prevertebral fluid or swelling. No visible canal hematoma. Disc levels:  Degenerative changes primarily at C5-C6 and C6-C7. Upper chest: Negative. Other: Partially visualized median sternotomy wires. Bilateral carotid bulb calcified plaques. IMPRESSION: 1. No acute intracranial hemorrhage. 2. No acute/traumatic cervical pathology. Electronically Signed   By: Anner Crete M.D.   On: 06/27/2018 01:24   Ct Cervical Spine Wo Contrast  Result Date: 06/27/2018 CLINICAL DATA:  84 year old female with C-spine trauma. EXAM: CT HEAD WITHOUT CONTRAST CT CERVICAL SPINE WITHOUT CONTRAST TECHNIQUE: Multidetector CT imaging of the head and cervical spine was performed following the standard protocol without intravenous contrast.  Multiplanar CT image reconstructions of the cervical spine were also generated. COMPARISON:  None. FINDINGS: CT HEAD FINDINGS Brain: There is mild age-related atrophy and chronic microvascular ischemic changes. There is no acute intracranial hemorrhage. No mass effect or midline shift. No extra-axial fluid collection. Vascular: No hyperdense vessel or unexpected calcification. Skull: Normal. Negative for fracture or focal lesion. Sinuses/Orbits: There is opacification of the right sphenoid sinus. The remainder of the visualized paranasal sinuses and mastoid air cells are clear. Other: None CT CERVICAL SPINE FINDINGS Alignment: No acute subluxation. There is grade 1 C4-C5 anterolisthesis. Skull base and vertebrae: No acute fracture. The bones are osteopenic. Soft tissues and spinal canal: No prevertebral fluid or swelling. No visible canal hematoma. Disc levels:  Degenerative changes primarily at C5-C6 and C6-C7. Upper chest: Negative. Other: Partially visualized median sternotomy wires. Bilateral carotid bulb calcified plaques. IMPRESSION: 1. No acute intracranial hemorrhage. 2. No acute/traumatic cervical pathology. Electronically Signed   By: Anner Crete M.D.   On: 06/27/2018 01:24   Ct Knee Right Wo Contrast  Result Date: 06/27/2018 CLINICAL DATA:  84 year old female with trauma to the right lower extremity. EXAM: CT OF THE right KNEE WITHOUT CONTRAST TECHNIQUE: Multidetector CT imaging of the right knee was performed according to the standard protocol. Multiplanar CT image reconstructions were also generated. COMPARISON:  Earlier radiograph dated 06/27/2018 FINDINGS: Bones/Joint/Cartilage There is a displaced and comminuted spiral fracture of the mid to distal femoral diaphysis extending to the distal femoral metaphysis and the superior portion of the femoral component of the knee arthroplasty. There is a partially visualized right femoral intramedullary rod. The fracture extends along the visualized  portion of the intramedullary rod. Comminuted and displaced fractures of the distal femoral metadiaphysis. The knee arthroplasty appears in anatomic alignment with limited evaluation due to streak artifact. There is no dislocation. There is a small to moderate suprapatellar effusion or hematoma. Ligaments Suboptimally assessed by CT. Muscles and Tendons Probable edema or intramuscular hematoma involving the visualized portion of the distal quadriceps musculature. Soft tissues Diffuse stranding of the subcutaneous fat as well as stranding of the fat in the posterior knee compartment. IMPRESSION: Displaced comminuted fracture of the mid to distal right femur as described. Electronically Signed   By: Anner Crete M.D.   On: 06/27/2018 04:59   Dg Knee  Complete 4 Views Right  Result Date: 06/27/2018 CLINICAL DATA:  84 year old female with fall and trauma to the right knee. EXAM: RIGHT KNEE - COMPLETE 4+ VIEW COMPARISON:  Right knee radiograph dated 03/26/2005 FINDINGS: Partially visualized femoral intramedullary fixation rod and distal screws. There is a comminuted spiral fracture of the mid to distal femur with approximately 7 mm distraction gap and medial displacement of the distal fracture fragment. The fracture extends to the distal intramedullary fixation rod and also extends to the distal femoral metadiaphysis above the femoral component of the knee arthroplasty. There is a total knee arthroplasty which appears intact and in anatomic alignment. The bones are osteopenic. Multiple surgical clips noted in the soft tissues of the medial aspect of the knee. Vascular calcification also noted. Small suprapatellar effusion. IMPRESSION: 1. Comminuted, mildly displaced spiral fracture of the mid to distal femur extending to the distal femoral metadiaphysis above the femoral component of the knee arthroplasty. 2. Partially visualized right femoral intramedullary fixation rod as well as total right knee arthroplasty.  Electronically Signed   By: Anner Crete M.D.   On: 06/27/2018 01:07   Dg Hip Unilat W Or Wo Pelvis 2-3 Views Right  Result Date: 06/27/2018 CLINICAL DATA:  [place] in kitchen.  RIGHT knee injury. EXAM: DG HIP (WITH OR WITHOUT PELVIS) 2-3V RIGHT COMPARISON:  None. FINDINGS: RIGHT femoral neck pinning with intramedullary rod, partially imaged distal femur fracture seen on single view. Extensive heterotopic calcific calcification proximal femur with cerclage wire. Osteopenia. No destructive bony lesions. No dislocation. Moderate vascular calcifications. Phleboliths project in the pelvis. IMPRESSION: Partially imaged acute distal femur fracture, recommend dedicated RIGHT femur radiograph. Electronically Signed   By: Elon Alas M.D.   On: 06/27/2018 01:02   Family History Reviewed and non-contributory, no pertinent history of problems with bleeding or anesthesia      Review of Systems 14 system ROS conducted and negative except for that noted in HPI   OBJECTIVE  Vitals: Patient Vitals for the past 8 hrs:  BP Temp Temp src Pulse Resp SpO2  06/27/18 0515 (!) 188/73 97.8 F (36.6 C) Axillary 63 - 98 %  06/27/18 0415 (!) 144/85 - - 68 14 99 %  06/27/18 0315 (!) 149/86 - - 73 18 98 %  06/27/18 0248 (!) 181/86 - - 76 15 99 %  06/27/18 0238 (!) 193/94 - - 78 - 98 %   General: Alert, no acute distress Cardiovascular: No pedal edema Respiratory: No cyanosis, no use of accessory musculature GI: No organomegaly, abdomen is soft and non-tender Skin: No lesions in the area of chief complaint other than those listed below in MSK exam.  Neurologic: Sensation intact distally save for the below mentioned MSK exam Psychiatric: Patient is competent for consent with normal mood and affect Lymphatic: No axillary or cervical lymphadenopathy Extremities  RLE: patient unable to test ROM at knee  Due to known fracture, wiggling toes, wwp foot, well healed medial parapatellar incision noted.  NVID.       Test Results Imaging XR and CT reviewed.  Tamala Julian and nephew trigen nail in place with fracture around distal screws just above well fixed total knee arthroplasty.  Labs cbc Recent Labs    06/27/18 0235  WBC 13.0*  HGB 12.5  HCT 38.8  PLT 193    Labs inflam No results for input(s): CRP in the last 72 hours.  Invalid input(s): ESR  Labs coag No results for input(s): INR, PTT in the last 72 hours.  Invalid input(s): PT  Recent Labs    06/27/18 0235  NA 142  K 3.9  CL 105  CO2 25  GLUCOSE 191*  BUN 34*  CREATININE 1.16*  CALCIUM 9.2     ASSESSMENT AND PLAN: 84 y.o. female with the following: Periprosthetic R femur fracture  Patient requires complex reconstruction better suited to a orthopedic traumatologist.  Dr. Doreatha Martin will be available tomorrow to perform the case.  Recommend consultation and his care. We discussed that without surgery this would be a difficult fracture to care for and the bedrest the patient would require would put her at risk for DVT, bed sores and other poor outcomes.   The risks benefits and alternatives were discussed with the patient including but not limited to the risks of nonoperative treatment, versus surgical intervention including infection, bleeding, nerve injury, periprosthetic fracture, the need for revision surgery, leg length discrepancy, gait change, blood clots, cardiopulmonary complications, morbidity, mortality, among others, and they were willing to proceed.     Plan for ORIF tomorrow 9/1 [place] for diet today.

## 2018-06-27 NOTE — ED Notes (Signed)
Patient transported to X-ray 

## 2018-06-27 NOTE — ED Notes (Signed)
Pt went to CT and is refusing the CT. Pt is demented and MD made aware at this time.

## 2018-06-27 NOTE — Progress Notes (Signed)
Unable to get adequate assessment due to patient's dementia

## 2018-06-27 NOTE — Progress Notes (Signed)
Initial Nutrition Assessment  DOCUMENTATION CODES:  Not applicable  INTERVENTION:  Ensure Surgery BID before/after surgery as diet allows.   Will Monitor intake and adjust interventions as warrantd   NUTRITION DIAGNOSIS:  Increased nutrient needs related to hip fracture as evidenced by estimated nutritional requirements for this condition  GOAL:  Patient will meet greater than or equal to 90% of their needs  MONITOR:  PO intake, Supplement acceptance, Labs, Weight trends, Diet advancement, I & O's  REASON FOR ASSESSMENT:  Consult Hip fracture protocol  ASSESSMENT:  84 y/o female PMHx HTN, DM2,CAD s/p CABG, CKD3, Afib. Presented to ED w/ R knee pain after falling at home. Work up showed a R mid-distal femur fx. Assessed by orthopedics who recommended surgery be performed by orthopedic traumatologist. Surgery scheduled for 9/1  On RD arrival to assess, pt is confused and combative with nursing care. She is unable to provide any appropriate responses to RD questions.   Per chart, there is no indication the patient had been eating poorly prior to Fall. SHe had been reported as being in usual state of health prior to fall.   Per chart, her weight was 132.8 lbs in May. Her admit weight this admission is listed at 129 lbs, but do not feel this was a measured weight. Bed scale today was 135 lbs.   With no mention of poor intake and weight largely appearing to be stable, feel her mild muscle/fat wasting is likely more related to age related sarcopenia as opposed to malnutrition.   Given the level of confusion displays, predict suboptimal nutrition intake. Given impending surgery, will order supplements.   Labs: BG:145-190, WBC:13, Bun/Creat:34/1.16 Meds Insulin, IVF, Robaxin, PRN morphine, TSH: 27.9,   Recent Labs  Lab 06/27/18 0235  NA 142  K 3.9  CL 105  CO2 25  BUN 34*  CREATININE 1.16*  CALCIUM 9.2  GLUCOSE 191*   NUTRITION - FOCUSED PHYSICAL EXAM:   Most Recent Value   Orbital Region  Mild depletion  Upper Arm Region  Mild depletion  Thoracic and Lumbar Region  Mild depletion  Buccal Region  Mild depletion  Temple Region  Mild depletion  Clavicle Bone Region  Mild depletion  Clavicle and Acromion Bone Region  Mild depletion  Scapular Bone Region  Unable to assess  Dorsal Hand  Mild depletion  Patellar Region  Unable to assess  Anterior Thigh Region  Unable to assess  Posterior Calf Region  Unable to assess     Diet Order:   Diet Order            Diet NPO time specified  Diet effective midnight        Diet regular Room service appropriate? Yes; Fluid consistency: Thin  Diet effective now             EDUCATION NEEDS:  No education needs have been identified at this time  Skin: MASD to sacrum   Last BM:  Unknown  Height:  Ht Readings from Last 1 Encounters:  06/26/18 5\' 2"  (1.575 m)   Weight:  Wt Readings from Last 1 Encounters:  06/26/18 58.5 kg   Wt Readings from Last 10 Encounters:  06/26/18 58.5 kg  03/04/18 60.2 kg  05/13/17 60.8 kg  02/18/17 61.2 kg  01/28/17 60.2 kg  12/19/15 64.1 kg  12/03/14 67.6 kg  10/04/14 67.6 kg  09/21/13 74.4 kg   Ideal Body Weight:  50 kg  BMI:  Body mass index is 23.59 kg/m.  Estimated  Nutritional Needs:  Kcal:  1600-1750 (27-30 kcal/kg bw) Protein:  70-82g/kg bw Fluid:  1.6-1.8 L fluid (1 ml/kcal)  Christophe Louis RD, LDN, CNSC Clinical Nutrition Available Tues-Sat via Pager: [phone] 06/27/2018 12:52 PM

## 2018-06-27 NOTE — ED Notes (Signed)
Patient transported to CT 

## 2018-06-27 NOTE — Progress Notes (Signed)
Orthopedic Tech Progress Note Patient Details:  Brittney GlassmanLottie M Craig 02-04-1926 [id number]  Ortho Devices Type of Ortho Device: Knee Immobilizer Ortho Device/Splint Location: rle Ortho Device/Splint Interventions: Application   Post Interventions Patient Tolerated: Well Instructions Provided: Care of device   Nikki DomCrawford, Arman Loy 06/27/2018, 10:47 AM

## 2018-06-27 NOTE — H&P (Signed)
History and Physical    VALLA PACEY ZOX:[id number] DOB: [birth year] DOA: 06/26/2018  PCP: Henrine Screws, MD   Patient coming from: Home   Chief Complaint: Fall with right knee pain   HPI: ARIES KASA is a 84 y.o. female with medical history significant for coronary artery disease status post CABG, paroxysmal atrial flutter no longer on anticoagulation due to recurrent falls, hypothyroidism, hypertension, chronic kidney disease stage III, and diet-controlled diabetes, now presenting to the emergency department with right knee pain after a fall at home.  Patient had reportedly been in her usual state of health with no complaints or recent illness when she fell at home last night.  She denies hitting her head or losing consciousness, but experienced immediate and severe pain at the right knee.  EMS was called and she was treated with fentanyl prior to arrival in the ED.  ED Course: Upon arrival to the ED, patient is found to be afebrile, saturating well on room air, hypertensive, and with vitals otherwise normal.  EKG features a sinus rhythm with QTc interval of 580 ms.  Chest x-ray is notable for cardiomegaly, but no acute cardiopulmonary disease.  Noncontrast head CT is negative for acute intracranial abnormality and cervical spine CT is negative for acute pathology.  Radiographs of the pelvis are negative for acute fracture and right femur films reveal mildly displaced fracture involving the mid to distal right femur.  Orthopedic surgery was consulted and recommended medical admission with the patient to be kept n.p.o.  Review of Systems:  All other systems reviewed and apart from HPI, are negative.  Past Medical History:  Diagnosis Date  . Bradycardia 09/21/2013   Heart rate of 48 noted on the EKG, while on beta blocker therapy (metoprolol 75 mg twice a day). Medication dosage decrease was undertaken   . CAD (coronary artery disease)   . 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 f/u with Dr Katrinka Blazing, amlodipine decreased. `E1o3L`discussed with Dr Everlene Other   . Chronic anticoagulation 05/13/2017  . Coronary artery disease involving coronary bypass graft of native heart with angina pectoris (HCC) 09/21/2013   CABG 1993, LIMA to LAD, SVG to OM2 and 3, SVG to PDA   . Diabetes (HCC)   . Diabetes mellitus, type 2 (HCC) 02/15/2014   Last Assessment & Plan:  Diabetes is improving with treatment.  Continue current treatment regimen. Diabetes will be reassessed in 3 months     The goal is for the Hgb A1C to be less than 7.0.  It is recommended that all diabetics are educated on and follow a healthy diabetic diet, exercise for 30 minutes 3-4 times per week (walking, biking, swimming, or machine), monitor blood glucose readings an  . Essential hypertension, benign 09/21/2013  . H/O deep venous thrombosis 10/23/2012   Overview:  hx of dvt tx 2012 with coumadin following surgery   . Hip fracture (HCC)   . History of cardiovascular surgery 10/23/2012   Overview:  1993. CABG> with LIMA to LAD, sequential SVG to oM, SVG to PDA> followed by Dr. Katrinka Blazing.   Marland Kitchen HTN (hypertension)   . Hypercholesteremia   . Hyperlipidemia   . Hypothyroidism   . Hypothyroidism   . Osteoarthritis   . Osteopenia   . Osteoporosis   . Paroxysmal atrial flutter 05/13/2017    Past Surgical History:  Procedure Laterality Date  . APPENDECTOMY    . CORONARY ARTERY BYPASS GRAFT     with LIMA  to LAD,sequential SVG to OM # 1 & 3, SVG to PDA. All grafts patent with normal LV function 2006, followed by cardiologist Dr. Katrinka BlazingSmith  . EYE SURGERY    . FRACTURE SURGERY    . HEMORRHOID SURGERY    . JOINT REPLACEMENT       reports that she has never smoked. She has never used smokeless tobacco. She reports that she does not drink alcohol or use drugs.  Allergies  Allergen Reactions  . Ace Inhibitors     Other reaction(s): Other (See Comments) cough    Family History    Problem Relation Age of Onset  . Heart disease Mother   . Hyperlipidemia Mother   . Stroke Mother   . Alzheimer's disease Brother      Prior to Admission medications   Medication Sig Start Date End Date Taking? Authorizing Provider  amLODipine (NORVASC) 2.5 MG tablet Take 2.5 mg by mouth daily.    [provider]  aspirin EC 81 MG tablet Take 1 tablet (81 mg total) by mouth daily. 03/05/18   Lyn RecordsSmith, Henry W, MD  calcium carbonate 1250 MG capsule Take 1,200 mg by mouth daily.     [provider]  CINNAMON PO Take 1 tablet by mouth daily.    [provider]  Cyanocobalamin (VITAMIN B 12 PO) Take 1,000 mg by mouth daily.    [provider]  Flaxseed, Linseed, (FLAX SEED OIL) 1000 MG CAPS Take 1 capsule by mouth daily.    [provider]  folic acid (FOLVITE) 400 MCG tablet Take 400 mcg by mouth daily.    [provider]  hydrochlorothiazide (HYDRODIURIL) 25 MG tablet Take 25 mg by mouth daily.    [provider]  levothyroxine (SYNTHROID, LEVOTHROID) 100 MCG tablet Take 100 mcg by mouth daily before breakfast.    [provider]  MAGNESIUM PO Take 1 tablet by mouth daily.    [provider]  metoprolol tartrate (LOPRESSOR) 25 MG tablet Take 0.5 tablets (12.5 mg total) by mouth 2 (two) times daily. 05/27/17 08/25/17  Lyn RecordsSmith, Henry W, MD  Multiple Vitamins-Minerals (CENTRUM SILVER PO) Take 1 tablet by mouth daily.    [provider]  Omega-3 1000 MG CAPS Take by mouth.    [provider]  pyridOXINE (VITAMIN B-6) 100 MG tablet Take 100 mg by mouth daily.    [provider]  simvastatin (ZOCOR) 10 MG tablet Take 10 mg by mouth daily. 09/11/15   [provider]    Physical Exam: Vitals:   06/26/18 2330 06/27/18 0238 06/27/18 0248 06/27/18 0315  BP: (!) 180/80 (!) 193/94 (!) 181/86 (!) 149/86  Pulse: 60 78 76 73  Resp:   15 18  Temp:      TempSrc:      SpO2: 97% 98% 99% 98%   Weight:      Height:          Constitutional: NAD, appears uncomfortable  Eyes: PERTLA, lids and conjunctivae normal ENMT: Mucous membranes are moist. Posterior pharynx clear of any exudate or lesions.   Neck: normal, supple, no masses, no thyromegaly Respiratory: clear to auscultation bilaterally, no wheezing, no crackles. Normal respiratory effort.    Cardiovascular: S1 & S2 heard, regular rate and rhythm. No extremity edema.   Abdomen: No distension, no tenderness, soft. Bowel sounds normal.  Musculoskeletal: no clubbing / cyanosis. Tender right thigh and knee, neurovascularly intact distally.    Skin: no significant rashes, lesions, ulcers. Warm, dry,  well-perfused. Neurologic: No facial asymmetry. Sensation to light touch intact. Strength 5/5 in all 4 limbs.  Psychiatric: Alert, oriented to person and place only. Calm.     Labs on Admission: I have personally reviewed following labs and imaging studies  CBC: Recent Labs  Lab 06/27/18 0235  WBC 13.0*  NEUTROABS 10.5*  HGB 12.5  HCT 38.8  MCV 93.3  PLT 193   Basic Metabolic Panel: Recent Labs  Lab 06/27/18 0235  NA 142  K 3.9  CL 105  CO2 25  GLUCOSE 191*  BUN 34*  CREATININE 1.16*  CALCIUM 9.2   GFR: Estimated Creatinine Clearance: 26 mL/min (A) (by C-G formula based on SCr of 1.16 mg/dL (H)). Liver Function Tests: No results for input(s): AST, ALT, ALKPHOS, BILITOT, PROT, ALBUMIN in the last 168 hours. No results for input(s): LIPASE, AMYLASE in the last 168 hours. No results for input(s): AMMONIA in the last 168 hours. Coagulation Profile: No results for input(s): INR, PROTIME in the last 168 hours. Cardiac Enzymes: No results for input(s): CKTOTAL, CKMB, CKMBINDEX, TROPONINI in the last 168 hours. BNP (last 3 results) No results for input(s): PROBNP in the last 8760 hours. HbA1C: No results for input(s): HGBA1C in the last 72 hours. CBG: No results for input(s): GLUCAP in the last 168  hours. Lipid Profile: No results for input(s): CHOL, HDL, LDLCALC, TRIG, CHOLHDL, LDLDIRECT in the last 72 hours. Thyroid Function Tests: No results for input(s): TSH, T4TOTAL, FREET4, T3FREE, THYROIDAB in the last 72 hours. Anemia Panel: No results for input(s): VITAMINB12, FOLATE, FERRITIN, TIBC, IRON, RETICCTPCT in the last 72 hours. Urine analysis:    Component Value Date/Time   COLORURINE YELLOW 06/27/2011 1220   APPEARANCEUR CLEAR 06/27/2011 1220   LABSPEC 1.020 06/27/2011 1220   PHURINE 5.0 06/27/2011 1220   GLUCOSEU NEGATIVE 06/27/2011 1220   HGBUR NEGATIVE 06/27/2011 1220   BILIRUBINUR NEGATIVE 06/27/2011 1220   KETONESUR NEGATIVE 06/27/2011 1220   PROTEINUR NEGATIVE 06/27/2011 1220   UROBILINOGEN 0.2 06/27/2011 1220   NITRITE NEGATIVE 06/27/2011 1220   LEUKOCYTESUR TRACE (A) 06/27/2011 1220   Sepsis Labs: @LABRCNTIP (procalcitonin:4,lacticidven:4) )No results found for this or any previous visit (from the past 240 hour(s)).   Radiological Exams on Admission: Dg Chest 1 View  Result Date: 06/27/2018 CLINICAL DATA:  Patient status post fall. EXAM: CHEST  1 VIEW COMPARISON:  Chest radiograph 06/23/2011 FINDINGS: Stable enlarged cardiac and mediastinal contours status post median sternotomy. No consolidative pulmonary opacities. No pleural effusion or pneumothorax. Thoracic spine degenerative changes. IMPRESSION: No acute cardiopulmonary process.  Cardiomegaly. Electronically Signed   By: Annia Belt M.D.   On: 06/27/2018 01:04   Ct Head Wo Contrast  Result Date: 06/27/2018 CLINICAL DATA:  84 year old female with C-spine trauma. EXAM: CT HEAD WITHOUT CONTRAST CT CERVICAL SPINE WITHOUT CONTRAST TECHNIQUE: Multidetector CT imaging of the head and cervical spine was performed following the standard protocol without intravenous contrast. Multiplanar CT image reconstructions of the cervical spine were also generated. COMPARISON:  None. FINDINGS: CT HEAD FINDINGS Brain: There is mild  age-related atrophy and chronic microvascular ischemic changes. There is no acute intracranial hemorrhage. No mass effect or midline shift. No extra-axial fluid collection. Vascular: No hyperdense vessel or unexpected calcification. Skull: Normal. Negative for fracture or focal lesion. Sinuses/Orbits: There is opacification of the right sphenoid sinus. The remainder of the visualized paranasal sinuses and mastoid air cells are clear. Other: None CT CERVICAL SPINE FINDINGS Alignment: No acute subluxation. There is grade 1 C4-C5 anterolisthesis. Skull  base and vertebrae: No acute fracture. The bones are osteopenic. Soft tissues and spinal canal: No prevertebral fluid or swelling. No visible canal hematoma. Disc levels:  Degenerative changes primarily at C5-C6 and C6-C7. Upper chest: Negative. Other: Partially visualized median sternotomy wires. Bilateral carotid bulb calcified plaques. IMPRESSION: 1. No acute intracranial hemorrhage. 2. No acute/traumatic cervical pathology. Electronically Signed   By: Elgie Collard M.D.   On: 06/27/2018 01:24   Ct Cervical Spine Wo Contrast  Result Date: 06/27/2018 CLINICAL DATA:  84 year old female with C-spine trauma. EXAM: CT HEAD WITHOUT CONTRAST CT CERVICAL SPINE WITHOUT CONTRAST TECHNIQUE: Multidetector CT imaging of the head and cervical spine was performed following the standard protocol without intravenous contrast. Multiplanar CT image reconstructions of the cervical spine were also generated. COMPARISON:  None. FINDINGS: CT HEAD FINDINGS Brain: There is mild age-related atrophy and chronic microvascular ischemic changes. There is no acute intracranial hemorrhage. No mass effect or midline shift. No extra-axial fluid collection. Vascular: No hyperdense vessel or unexpected calcification. Skull: Normal. Negative for fracture or focal lesion. Sinuses/Orbits: There is opacification of the right sphenoid sinus. The remainder of the visualized paranasal sinuses and  mastoid air cells are clear. Other: None CT CERVICAL SPINE FINDINGS Alignment: No acute subluxation. There is grade 1 C4-C5 anterolisthesis. Skull base and vertebrae: No acute fracture. The bones are osteopenic. Soft tissues and spinal canal: No prevertebral fluid or swelling. No visible canal hematoma. Disc levels:  Degenerative changes primarily at C5-C6 and C6-C7. Upper chest: Negative. Other: Partially visualized median sternotomy wires. Bilateral carotid bulb calcified plaques. IMPRESSION: 1. No acute intracranial hemorrhage. 2. No acute/traumatic cervical pathology. Electronically Signed   By: Elgie Collard M.D.   On: 06/27/2018 01:24   Dg Knee Complete 4 Views Right  Result Date: 06/27/2018 CLINICAL DATA:  84 year old female with fall and trauma to the right knee. EXAM: RIGHT KNEE - COMPLETE 4+ VIEW COMPARISON:  Right knee radiograph dated 03/26/2005 FINDINGS: Partially visualized femoral intramedullary fixation rod and distal screws. There is a comminuted spiral fracture of the mid to distal femur with approximately 7 mm distraction gap and medial displacement of the distal fracture fragment. The fracture extends to the distal intramedullary fixation rod and also extends to the distal femoral metadiaphysis above the femoral component of the knee arthroplasty. There is a total knee arthroplasty which appears intact and in anatomic alignment. The bones are osteopenic. Multiple surgical clips noted in the soft tissues of the medial aspect of the knee. Vascular calcification also noted. Small suprapatellar effusion. IMPRESSION: 1. Comminuted, mildly displaced spiral fracture of the mid to distal femur extending to the distal femoral metadiaphysis above the femoral component of the knee arthroplasty. 2. Partially visualized right femoral intramedullary fixation rod as well as total right knee arthroplasty. Electronically Signed   By: Elgie Collard M.D.   On: 06/27/2018 01:07   Dg Hip Unilat W Or Wo  Pelvis 2-3 Views Right  Result Date: 06/27/2018 CLINICAL DATA:  Larey Seat in kitchen.  RIGHT knee injury. EXAM: DG HIP (WITH OR WITHOUT PELVIS) 2-3V RIGHT COMPARISON:  None. FINDINGS: RIGHT femoral neck pinning with intramedullary rod, partially imaged distal femur fracture seen on single view. Extensive heterotopic calcific calcification proximal femur with cerclage wire. Osteopenia. No destructive bony lesions. No dislocation. Moderate vascular calcifications. Phleboliths project in the pelvis. IMPRESSION: Partially imaged acute distal femur fracture, recommend dedicated RIGHT femur radiograph. Electronically Signed   By: Awilda Metro M.D.   On: 06/27/2018 01:02    EKG: Independently  reviewed. Sinus rhythm, QTc 580 ms.   Assessment/Plan   1. Right femur fracture  - Presents with severe right leg pain after a mechanical fall at home  - Radiographs reveal mildly displaced fracture involving mid to distal right femur  - Orthopedic surgery is consulting and much appreciated, asks that pt be kept NPO  - Based on the available data, Ms. Markham Jordan presents an estimated 1.8% risk for perioperative MI or cardiac arrest per Nolon Nations al  - Keep NPO, hold ASA, continue supportive care with IVF hydration and analgesia    2. CAD  - Status-post CABG with patent grafts in 2002 and 2006  - Denies any recent chest pain or SOB  - Hold ASA in anticipation of surgery, continue statin and beta-blocker as tolerated    3. Paroxysmal atrial flutter  - In sinus rhythm on admission  - CHADS-VASc at least 29 (age x2, gender, CAD, HTN, DM)  - Previously anticoagulated but discontinued in May after d/t recurrent falls   4. Prolonged QT interval  - QTc is 580 on admission EKG  - Continue cardiac monitoring, replace K to 4 and mag to 2, avoid offending medications, check TSH    5. Hypothyroidism  - Check TSH in light of prolonged QT  - Continue Synthroid    6. Hypertension  - BP elevated in ED, pain likely  contributing  - Treat pain, hold HCTZ while NPO, continue Lopressor and Norvasc as tolerated    7. CKD stage III  - SCr is 1.16 on admission, similar to priors  - Renally-dose medications   8. Type II DM  - Currently diet-controlled at home  - Check CBG's and use low-intensity SSI with Novolog as needed while in hospital     DVT prophylaxis: SCD's  Code Status: Full  Family Communication: Discussed with patient Consults called: Orthopedic surgery  Admission status: Inpatient     Briscoe Deutscher, MD [facility] Pager [phone]  If 7PM-7AM, please contact night-coverage [email or web address] Password [email or web address]  06/27/2018, 4:12 AM

## 2018-06-28 ENCOUNTER — Encounter (HOSPITAL_COMMUNITY)
Admission: EM | Disposition: A | Discharge status: Home / Self Care | Payer: Self-pay | Source: Home / Self Care | Attending: Internal Medicine

## 2018-06-28 ENCOUNTER — Inpatient Hospital Stay (HOSPITAL_COMMUNITY): Payer: Medicare Other | Admitting: Certified Registered"

## 2018-06-28 ENCOUNTER — Inpatient Hospital Stay (HOSPITAL_COMMUNITY): Payer: Medicare Other

## 2018-06-28 HISTORY — PX: ORIF FEMUR FRACTURE: SHX2119

## 2018-06-28 LAB — BASIC METABOLIC PANEL
Anion gap: 9 (ref 5–15)
BUN: 20 mg/dL (ref 8–23)
CO2: 27 mmol/L (ref 22–32)
Calcium: 8.8 mg/dL — ABNORMAL LOW (ref 8.9–10.3)
Chloride: 105 mmol/L (ref 98–111)
Creatinine, Ser: 0.96 mg/dL (ref 0.44–1.00)
GFR calc Af Amer: 59 mL/min — ABNORMAL LOW (ref >60)
GFR calc non Af Amer: 51 mL/min — ABNORMAL LOW (ref >60)
Glucose, Bld: 164 mg/dL — ABNORMAL HIGH (ref 70–99)
Potassium: 4.2 mmol/L (ref 3.5–5.1)
Sodium: 141 mmol/L (ref 135–145)

## 2018-06-28 LAB — GLUCOSE, CAPILLARY
Glucose-Capillary: 130 mg/dL — ABNORMAL HIGH (ref 70–99)
Glucose-Capillary: 139 mg/dL — ABNORMAL HIGH (ref 70–99)
Glucose-Capillary: 156 mg/dL — ABNORMAL HIGH (ref 70–99)
Glucose-Capillary: 194 mg/dL — ABNORMAL HIGH (ref 70–99)
Glucose-Capillary: 168 mg/dL — ABNORMAL HIGH (ref 70–99)

## 2018-06-28 LAB — MAGNESIUM: Magnesium: 2 mg/dL (ref 1.7–2.4)

## 2018-06-28 LAB — MRSA PCR SCREENING: MRSA by PCR: NEGATIVE

## 2018-06-28 SURGERY — OPEN REDUCTION INTERNAL FIXATION (ORIF) DISTAL FEMUR FRACTURE
Anesthesia: General | Site: Leg Upper | Laterality: Right

## 2018-06-28 MED ORDER — GLYCOPYRROLATE 0.2 MG/ML IJ SOLN
INTRAMUSCULAR | Status: DC | PRN
  Administered 2018-06-28: 0.2 mg via INTRAVENOUS

## 2018-06-28 MED ORDER — OXYCODONE HCL 5 MG/5ML PO SOLN: 5.0000 mg | Freq: Once | ORAL | Status: DC | PRN

## 2018-06-28 MED ORDER — LEVOTHYROXINE SODIUM 25 MCG PO TABS
125.0000 ug | ORAL_TABLET | Freq: Every day | ORAL | Status: DC
  Administered 2018-06-29 – 2018-07-01 (×3): 125 ug via ORAL
  Filled 2018-06-28 (×4): qty 1

## 2018-06-28 MED ORDER — LACTATED RINGERS IV SOLN
INTRAVENOUS | Status: DC | PRN
  Administered 2018-06-28 (×2): via INTRAVENOUS

## 2018-06-28 MED ORDER — OXYCODONE HCL 5 MG PO TABS: 5.0000 mg | ORAL_TABLET | Freq: Once | ORAL | Status: DC | PRN

## 2018-06-28 MED ORDER — ONDANSETRON HCL 4 MG/2ML IJ SOLN
INTRAMUSCULAR | Status: DC | PRN
  Administered 2018-06-28: 4 mg via INTRAVENOUS

## 2018-06-28 MED ORDER — ESMOLOL HCL 100 MG/10ML IV SOLN
INTRAVENOUS | Status: DC | PRN
  Administered 2018-06-28: 10 mg via INTRAVENOUS
  Administered 2018-06-28: 5 mg via INTRAVENOUS

## 2018-06-28 MED ORDER — HYDROCODONE-ACETAMINOPHEN 5-325 MG PO TABS
1.0000 | ORAL_TABLET | Freq: Four times a day (QID) | ORAL | Status: DC | PRN
  Administered 2018-06-29: 1 via ORAL
  Filled 2018-06-28: qty 1

## 2018-06-28 MED ORDER — 0.9 % SODIUM CHLORIDE (POUR BTL) OPTIME
TOPICAL | Status: DC | PRN
  Administered 2018-06-28: 1000 mL

## 2018-06-28 MED ORDER — MORPHINE SULFATE (PF) 2 MG/ML IV SOLN: 0.5000 mg | INTRAVENOUS | Status: DC | PRN

## 2018-06-28 MED ORDER — VANCOMYCIN HCL 1000 MG IV SOLR
INTRAVENOUS | Status: AC
  Filled 2018-06-28: qty 1000

## 2018-06-28 MED ORDER — MENTHOL 3 MG MT LOZG
1.0000 | LOZENGE | OROMUCOSAL | Status: DC | PRN
  Filled 2018-06-28: qty 9

## 2018-06-28 MED ORDER — CEFAZOLIN SODIUM-DEXTROSE 2-3 GM-%(50ML) IV SOLR
INTRAVENOUS | Status: DC | PRN
  Administered 2018-06-28: 2 g via INTRAVENOUS

## 2018-06-28 MED ORDER — EPHEDRINE SULFATE 50 MG/ML IJ SOLN
INTRAMUSCULAR | Status: DC | PRN
  Administered 2018-06-28: 5 mg via INTRAVENOUS

## 2018-06-28 MED ORDER — ACETAMINOPHEN 325 MG PO TABS
650.0000 mg | ORAL_TABLET | Freq: Four times a day (QID) | ORAL | Status: DC | PRN
  Administered 2018-06-28 – 2018-07-01 (×6): 650 mg via ORAL
  Filled 2018-06-28 (×7): qty 2

## 2018-06-28 MED ORDER — FENTANYL CITRATE (PF) 250 MCG/5ML IJ SOLN
INTRAMUSCULAR | Status: DC | PRN
  Administered 2018-06-28: 25 ug via INTRAVENOUS
  Administered 2018-06-28: 50 ug via INTRAVENOUS
  Administered 2018-06-28: 75 ug via INTRAVENOUS
  Administered 2018-06-28 (×2): 50 ug via INTRAVENOUS

## 2018-06-28 MED ORDER — VANCOMYCIN HCL 1000 MG IV SOLR
INTRAVENOUS | Status: DC | PRN
  Administered 2018-06-28: 1000 mg

## 2018-06-28 MED ORDER — CEFAZOLIN SODIUM-DEXTROSE 2-4 GM/100ML-% IV SOLN
2.0000 g | Freq: Two times a day (BID) | INTRAVENOUS | Status: AC
  Administered 2018-06-28 – 2018-06-29 (×2): 2 g via INTRAVENOUS
  Filled 2018-06-28 (×2): qty 100

## 2018-06-28 MED ORDER — SUGAMMADEX SODIUM 200 MG/2ML IV SOLN
INTRAVENOUS | Status: DC | PRN
  Administered 2018-06-28: 117 mg via INTRAVENOUS

## 2018-06-28 MED ORDER — LIDOCAINE HCL (CARDIAC) PF 100 MG/5ML IV SOSY
PREFILLED_SYRINGE | INTRAVENOUS | Status: DC | PRN
  Administered 2018-06-28: 40 mg via INTRATRACHEAL

## 2018-06-28 MED ORDER — PROPOFOL 10 MG/ML IV BOLUS
INTRAVENOUS | Status: AC
  Filled 2018-06-28: qty 40

## 2018-06-28 MED ORDER — FENTANYL CITRATE (PF) 250 MCG/5ML IJ SOLN
INTRAMUSCULAR | Status: AC
  Filled 2018-06-28: qty 5

## 2018-06-28 MED ORDER — SODIUM CHLORIDE 0.9 % IV SOLN
INTRAVENOUS | Status: DC | PRN
  Administered 2018-06-28: 40 ug/min via INTRAVENOUS

## 2018-06-28 MED ORDER — PROPOFOL 10 MG/ML IV BOLUS
INTRAVENOUS | Status: DC | PRN
  Administered 2018-06-28: 50 mg via INTRAVENOUS
  Administered 2018-06-28: 10 mg via INTRAVENOUS

## 2018-06-28 MED ORDER — FENTANYL CITRATE (PF) 100 MCG/2ML IJ SOLN
25.0000 ug | INTRAMUSCULAR | Status: DC | PRN
  Administered 2018-06-28: 25 ug via INTRAVENOUS

## 2018-06-28 MED ORDER — ROCURONIUM BROMIDE 100 MG/10ML IV SOLN
INTRAVENOUS | Status: DC | PRN
  Administered 2018-06-28: 40 mg via INTRAVENOUS

## 2018-06-28 MED ORDER — PHENYLEPHRINE HCL 10 MG/ML IJ SOLN
INTRAMUSCULAR | Status: DC | PRN
  Administered 2018-06-28: 40 ug via INTRAVENOUS

## 2018-06-28 MED ORDER — FENTANYL CITRATE (PF) 100 MCG/2ML IJ SOLN
INTRAMUSCULAR | Status: AC
  Administered 2018-06-28: 25 ug via INTRAVENOUS
  Filled 2018-06-28: qty 2

## 2018-06-28 SURGICAL SUPPLY — 53 items
BIT DRILL 4.3 (BIT) IMPLANT
BIT DRILL LONG 3.3 (BIT) ×2 IMPLANT
BIT DRILL QC 3.3X195 (BIT) ×1 IMPLANT
BNDG COHESIVE 6X5 TAN STRL LF (GAUZE/BANDAGES/DRESSINGS) ×2 IMPLANT
BRUSH SCRUB SURG 4.25 DISP (MISCELLANEOUS) ×4 IMPLANT
CANISTER SUCT 3000ML PPV (MISCELLANEOUS) ×2 IMPLANT
CAP LOCK NCB (Cap) ×11 IMPLANT
CHLORAPREP W/TINT 26ML (MISCELLANEOUS) ×2 IMPLANT
COVER SURGICAL LIGHT HANDLE (MISCELLANEOUS) ×2 IMPLANT
DRAPE C-ARM 42X72 X-RAY (DRAPES) ×2 IMPLANT
DRAPE C-ARMOR (DRAPES) ×2 IMPLANT
DRAPE ORTHO SPLIT 77X108 STRL (DRAPES) ×4
DRAPE PROXIMA HALF (DRAPES) ×4 IMPLANT
DRAPE SURG 17X23 STRL (DRAPES) ×2 IMPLANT
DRAPE SURG ORHT 6 SPLT 77X108 (DRAPES) ×2 IMPLANT
DRAPE U-SHAPE 47X51 STRL (DRAPES) ×2 IMPLANT
DRILL BIT 4.3 (BIT) ×2
DRSG MEPILEX BORDER 4X8 (GAUZE/BANDAGES/DRESSINGS) ×2 IMPLANT
ELECT REM PT RETURN 9FT ADLT (ELECTROSURGICAL) ×2
ELECTRODE REM PT RTRN 9FT ADLT (ELECTROSURGICAL) ×1 IMPLANT
GLOVE BIO SURGEON STRL SZ7.5 (GLOVE) ×7 IMPLANT
GLOVE BIOGEL PI IND STRL 7.5 (GLOVE) ×1 IMPLANT
GLOVE BIOGEL PI INDICATOR 7.5 (GLOVE) ×1
GOWN STRL REUS W/ TWL LRG LVL3 (GOWN DISPOSABLE) ×3 IMPLANT
GOWN STRL REUS W/TWL LRG LVL3 (GOWN DISPOSABLE) ×4
K-WIRE 2.0 (WIRE) ×2
K-WIRE FXSTD 280X2XNS SS (WIRE) ×1
KIT BASIN OR (CUSTOM PROCEDURE TRAY) ×2 IMPLANT
KIT TURNOVER KIT B (KITS) ×2 IMPLANT
KWIRE FXSTD 280X2XNS SS (WIRE) IMPLANT
NS IRRIG 1000ML POUR BTL (IV SOLUTION) ×2 IMPLANT
PACK TOTAL JOINT (CUSTOM PROCEDURE TRAY) ×2 IMPLANT
PAD ARMBOARD 7.5X6 YLW CONV (MISCELLANEOUS) ×4 IMPLANT
PLATE FEM DIST NCB PP 278MM (Plate) ×1 IMPLANT
SCREW 5.0 70MM (Screw) ×1 IMPLANT
SCREW 5.0 80MM (Screw) ×2 IMPLANT
SCREW NCB 3.5X75X5X6.2XST (Screw) ×2 IMPLANT
SCREW NCB 4.0MX42M (Screw) ×1 IMPLANT
SCREW NCB 4.0MX44M (Screw) ×1 IMPLANT
SCREW NCB 4.0X40MM (Screw) ×1 IMPLANT
SCREW NCB 5.0X75MM (Screw) ×4 IMPLANT
SCREW UNICORT 5.0X18MM (Screw) ×2 IMPLANT
SCREW UNICORTICAL 5.0X18 (Screw) IMPLANT
SCREW UNICORTICAL NCB 5.0X20 (Screw) ×1 IMPLANT
SUCTION FRAZIER HANDLE 10FR (MISCELLANEOUS) ×1
SUCTION TUBE FRAZIER 10FR DISP (MISCELLANEOUS) ×1 IMPLANT
SUT ETHILON 3 0 PS 1 (SUTURE) ×1 IMPLANT
SUT VIC AB 0 CT1 27 (SUTURE) ×2
SUT VIC AB 0 CT1 27XBRD ANBCTR (SUTURE) IMPLANT
SUT VIC AB 2-0 CT1 27 (SUTURE) ×2
SUT VIC AB 2-0 CT1 TAPERPNT 27 (SUTURE) IMPLANT
TOWEL OR 17X26 10 PK STRL BLUE (TOWEL DISPOSABLE) ×4 IMPLANT
WATER STERILE IRR 1000ML POUR (IV SOLUTION) ×3 IMPLANT

## 2018-06-28 NOTE — Progress Notes (Signed)
PROGRESS NOTE    [name]  XLK:[id number] DOB: [birth year] DOA: 06/26/2018 PCP: Henrine Screws, MD    Brief Narrative:[name] is a 84 y.o. female with medical history significant for coronary artery disease status post CABG, paroxysmal atrial flutter no longer on anticoagulation due to recurrent falls, hypothyroidism, hypertension, chronic kidney disease stage III, and diet-controlled diabetes, now presenting to the emergency department with right knee pain after a fall at home.  Patient had reportedly been in her usual state of health with no complaints or recent illness when she fell at home last night.  She denies hitting her head or losing consciousness, but experienced immediate and severe pain at the right knee.  EMS was called and she was treated with fentanyl prior to arrival in the ED.  ED Course: Upon arrival to the ED, patient is found to be afebrile, saturating well on room air, hypertensive, and with vitals otherwise normal.  EKG features a sinus rhythm with QTc interval of 580 ms.  Chest x-ray is notable for cardiomegaly, but no acute cardiopulmonary disease.  Noncontrast head CT is negative for acute intracranial abnormality and cervical spine CT is negative for acute pathology.  Radiographs of the pelvis are negative for acute fracture and right femur films reveal mildly displaced fracture involving the mid to distal right femur.  Orthopedic surgery was consulted and recommended medical admission with the patient to be kept n.p.o.   Assessment & Plan:   Principal Problem:   Closed fracture of distal end of right femur (HCC) Active Problems:   Coronary artery disease involving coronary bypass graft of native heart with angina pectoris (HCC)   Essential hypertension, benign   Adult hypothyroidism   Diabetes mellitus, type 2 (HCC)   Paroxysmal atrial flutter   CKD (chronic kidney disease), stage III (HCC)   Prolonged QT interval   Closed fracture of right  femur, initial encounter (HCC)  1-Right femur fracture;  Open reduction and fixation of right distal femur. Removal of hardware of right femur.  Post sx 9-01.  Pain management.  DVT per ortho  A fib;  not on anticoagulation.  Continue with metoprolol.    Prolong Qt  Repeat EKG in am.   Hypothyroidism; continue with synthroid. Increase dose TSh at 26   DM; SSI.   CKD stage III;  Monitor Monitor renal function.   Delirium;  In setting of acute illness.   DVT prophylaxis: per ortho Code Status: full code.  Family Communication: no family at bedside.  Disposition Plan: remain in hospital.   Consultants:   ortho   Procedures:   sx   Antimicrobials:   none   Subjective: She just came from sx,. Appears more calm.  Denies chest pain, no leg pain.    Objective: Vitals:   06/28/18 0945 06/28/18 1000 06/28/18 1015 06/28/18 1036  BP: (!) 150/95 (!) 168/83 (!) 151/70 (!) 158/77  Pulse: 83 88 77 84  Resp: 12 20 13 16   Temp: (!) 97.5 F (36.4 C)  (!) 97.3 F (36.3 C) 98 F (36.7 C)  TempSrc:    Oral  SpO2: 100% 100% 96% 100%  Weight:      Height:        Intake/Output Summary (Last 24 hours) at 06/28/2018 1242 Last data filed at 06/28/2018 0919 Gross per 24 hour  Intake 3052.69 ml  Output 350 ml  Net 2702.69 ml   Filed Weights   06/26/18 2313  Weight: 58.5 kg    Examination:  General exam: Appears calm and comfortable  Respiratory system: Clear to auscultation. Respiratory effort normal. Cardiovascular system: S1 & S2 heard, RRR. No JVD, murmurs, rubs, gallops or clicks. No pedal edema. Gastrointestinal system: Abdomen is nondistended, soft and nontender. No organomegaly or masses felt. Normal bowel sounds heard. Central nervous system: Alert Extremities: right LE with clean dressing.  Skin: No rashes, lesions or ulcers   Data Reviewed: I have personally reviewed following labs and imaging studies  CBC: Recent Labs  Lab 06/27/18 0235    WBC 13.0*  NEUTROABS 10.5*  HGB 12.5  HCT 38.8  MCV 93.3  PLT 193   Basic Metabolic Panel: Recent Labs  Lab 06/27/18 0235  NA 142  K 3.9  CL 105  CO2 25  GLUCOSE 191*  BUN 34*  CREATININE 1.16*  CALCIUM 9.2   GFR: Estimated Creatinine Clearance: 26 mL/min (A) (by C-G formula based on SCr of 1.16 mg/dL (H)). Liver Function Tests: No results for input(s): AST, ALT, ALKPHOS, BILITOT, PROT, ALBUMIN in the last 168 hours. No results for input(s): LIPASE, AMYLASE in the last 168 hours. No results for input(s): AMMONIA in the last 168 hours. Coagulation Profile: Recent Labs  Lab 06/27/18 0823  INR 1.05   Cardiac Enzymes: No results for input(s): CKTOTAL, CKMB, CKMBINDEX, TROPONINI in the last 168 hours. BNP (last 3 results) No results for input(s): PROBNP in the last 8760 hours. HbA1C: No results for input(s): HGBA1C in the last 72 hours. CBG: Recent Labs  Lab 06/27/18 1145 06/27/18 1623 06/28/18 0504 06/28/18 0947 06/28/18 1123  GLUCAP 161* 232* 130* 139* 156*   Lipid Profile: No results for input(s): CHOL, HDL, LDLCALC, TRIG, CHOLHDL, LDLDIRECT in the last 72 hours. Thyroid Function Tests: Recent Labs    06/27/18 0411  TSH 27.292*   Anemia Panel: No results for input(s): VITAMINB12, FOLATE, FERRITIN, TIBC, IRON, RETICCTPCT in the last 72 hours. Sepsis Labs: No results for input(s): PROCALCITON, LATICACIDVEN in the last 168 hours.  Recent Results (from the past 240 hour(s))  MRSA PCR Screening     Status: None   Collection Time: 06/28/18  5:13 AM  Result Value Ref Range Status   MRSA by PCR NEGATIVE NEGATIVE Final    Comment:        The GeneXpert MRSA Assay (FDA approved for NASAL specimens only), is one component of a comprehensive MRSA colonization surveillance program. It is not intended to diagnose MRSA infection nor to guide or monitor treatment for MRSA infections. Performed at [facility], [address] [place], [place]  [address]          Radiology Studies: Dg Chest 1 View  Result Date: 06/27/2018 CLINICAL DATA:  Patient status post fall. EXAM: CHEST  1 VIEW COMPARISON:  Chest radiograph 06/23/2011 FINDINGS: Stable enlarged cardiac and mediastinal contours status post median sternotomy. No consolidative pulmonary opacities. No pleural effusion or pneumothorax. Thoracic spine degenerative changes. IMPRESSION: No acute cardiopulmonary process.  Cardiomegaly. Electronically Signed   By: Annia Belt M.D.   On: 06/27/2018 01:04   Ct Head Wo Contrast  Result Date: 06/27/2018 CLINICAL DATA:  84 year old female with C-spine trauma. EXAM: CT HEAD WITHOUT CONTRAST CT CERVICAL SPINE WITHOUT CONTRAST TECHNIQUE: Multidetector CT imaging of the head and cervical spine was performed following the standard protocol without intravenous contrast. Multiplanar CT image reconstructions of the cervical spine were also generated. COMPARISON:  None. FINDINGS: CT HEAD FINDINGS Brain: There is mild age-related atrophy and chronic microvascular ischemic changes. There is  no acute intracranial hemorrhage. No mass effect or midline shift. No extra-axial fluid collection. Vascular: No hyperdense vessel or unexpected calcification. Skull: Normal. Negative for fracture or focal lesion. Sinuses/Orbits: There is opacification of the right sphenoid sinus. The remainder of the visualized paranasal sinuses and mastoid air cells are clear. Other: None CT CERVICAL SPINE FINDINGS Alignment: No acute subluxation. There is grade 1 C4-C5 anterolisthesis. Skull base and vertebrae: No acute fracture. The bones are osteopenic. Soft tissues and spinal canal: No prevertebral fluid or swelling. No visible canal hematoma. Disc levels:  Degenerative changes primarily at C5-C6 and C6-C7. Upper chest: Negative. Other: Partially visualized median sternotomy wires. Bilateral carotid bulb calcified plaques. IMPRESSION: 1. No acute intracranial hemorrhage. 2. No  acute/traumatic cervical pathology. Electronically Signed   By: Elgie Collard M.D.   On: 06/27/2018 01:24   Ct Cervical Spine Wo Contrast  Result Date: 06/27/2018 CLINICAL DATA:  84 year old female with C-spine trauma. EXAM: CT HEAD WITHOUT CONTRAST CT CERVICAL SPINE WITHOUT CONTRAST TECHNIQUE: Multidetector CT imaging of the head and cervical spine was performed following the standard protocol without intravenous contrast. Multiplanar CT image reconstructions of the cervical spine were also generated. COMPARISON:  None. FINDINGS: CT HEAD FINDINGS Brain: There is mild age-related atrophy and chronic microvascular ischemic changes. There is no acute intracranial hemorrhage. No mass effect or midline shift. No extra-axial fluid collection. Vascular: No hyperdense vessel or unexpected calcification. Skull: Normal. Negative for fracture or focal lesion. Sinuses/Orbits: There is opacification of the right sphenoid sinus. The remainder of the visualized paranasal sinuses and mastoid air cells are clear. Other: None CT CERVICAL SPINE FINDINGS Alignment: No acute subluxation. There is grade 1 C4-C5 anterolisthesis. Skull base and vertebrae: No acute fracture. The bones are osteopenic. Soft tissues and spinal canal: No prevertebral fluid or swelling. No visible canal hematoma. Disc levels:  Degenerative changes primarily at C5-C6 and C6-C7. Upper chest: Negative. Other: Partially visualized median sternotomy wires. Bilateral carotid bulb calcified plaques. IMPRESSION: 1. No acute intracranial hemorrhage. 2. No acute/traumatic cervical pathology. Electronically Signed   By: Elgie Collard M.D.   On: 06/27/2018 01:24   Ct Knee Right Wo Contrast  Result Date: 06/27/2018 CLINICAL DATA:  84 year old female with trauma to the right lower extremity. EXAM: CT OF THE right KNEE WITHOUT CONTRAST TECHNIQUE: Multidetector CT imaging of the right knee was performed according to the standard protocol. Multiplanar CT image  reconstructions were also generated. COMPARISON:  Earlier radiograph dated 06/27/2018 FINDINGS: Bones/Joint/Cartilage There is a displaced and comminuted spiral fracture of the mid to distal femoral diaphysis extending to the distal femoral metaphysis and the superior portion of the femoral component of the knee arthroplasty. There is a partially visualized right femoral intramedullary rod. The fracture extends along the visualized portion of the intramedullary rod. Comminuted and displaced fractures of the distal femoral metadiaphysis. The knee arthroplasty appears in anatomic alignment with limited evaluation due to streak artifact. There is no dislocation. There is a small to moderate suprapatellar effusion or hematoma. Ligaments Suboptimally assessed by CT. Muscles and Tendons Probable edema or intramuscular hematoma involving the visualized portion of the distal quadriceps musculature. Soft tissues Diffuse stranding of the subcutaneous fat as well as stranding of the fat in the posterior knee compartment. IMPRESSION: Displaced comminuted fracture of the mid to distal right femur as described. Electronically Signed   By: Elgie Collard M.D.   On: 06/27/2018 04:59   Dg Knee Complete 4 Views Right  Result Date: 06/27/2018 CLINICAL DATA:  84 year old female  with fall and trauma to the right knee. EXAM: RIGHT KNEE - COMPLETE 4+ VIEW COMPARISON:  Right knee radiograph dated 03/26/2005 FINDINGS: Partially visualized femoral intramedullary fixation rod and distal screws. There is a comminuted spiral fracture of the mid to distal femur with approximately 7 mm distraction gap and medial displacement of the distal fracture fragment. The fracture extends to the distal intramedullary fixation rod and also extends to the distal femoral metadiaphysis above the femoral component of the knee arthroplasty. There is a total knee arthroplasty which appears intact and in anatomic alignment. The bones are osteopenic. Multiple  surgical clips noted in the soft tissues of the medial aspect of the knee. Vascular calcification also noted. Small suprapatellar effusion. IMPRESSION: 1. Comminuted, mildly displaced spiral fracture of the mid to distal femur extending to the distal femoral metadiaphysis above the femoral component of the knee arthroplasty. 2. Partially visualized right femoral intramedullary fixation rod as well as total right knee arthroplasty. Electronically Signed   By: Elgie Collard M.D.   On: 06/27/2018 01:07   Dg C-arm 1-60 Min  Result Date: 06/28/2018 CLINICAL DATA:  ORIF of a distal RIGHT femur fracture. EXAM: Operative RIGHT FEMUR 2 VIEWS COMPARISON:  06/27/2018, 06/23/2011. FINDINGS: Eight spot images from the C-arm fluoroscopic device, AP and LATERAL views of the RIGHT femur obtained during the ORIF procedure are submitted for interpretation post-operatively. ORIF of the comminuted distal RIGHT femoral metadiaphyseal fracture with placement of a new plate laterally. This is in addition to the previously placed intramedullary nail at the time of in intertrochanteric femoral neck fracture in 2012. Alignment appears near anatomic on the operative images. Prior RIGHT knee arthroplasty without complicating features. The radiologic technologist documented 101 seconds of fluoroscopy time. IMPRESSION: ORIF of the comminuted distal RIGHT femoral metadiaphyseal fracture with placement of a new plate laterally. Electronically Signed   By: Hulan Saas M.D.   On: 06/28/2018 11:18   Dg Hip Unilat W Or Wo Pelvis 2-3 Views Right  Result Date: 06/27/2018 CLINICAL DATA:  Larey Seat in kitchen.  RIGHT knee injury. EXAM: DG HIP (WITH OR WITHOUT PELVIS) 2-3V RIGHT COMPARISON:  None. FINDINGS: RIGHT femoral neck pinning with intramedullary rod, partially imaged distal femur fracture seen on single view. Extensive heterotopic calcific calcification proximal femur with cerclage wire. Osteopenia. No destructive bony lesions. No  dislocation. Moderate vascular calcifications. Phleboliths project in the pelvis. IMPRESSION: Partially imaged acute distal femur fracture, recommend dedicated RIGHT femur radiograph. Electronically Signed   By: Awilda Metro M.D.   On: 06/27/2018 01:02   Dg Femur, Min 2 Views Right  Result Date: 06/28/2018 CLINICAL DATA:  ORIF of a distal RIGHT femur fracture. EXAM: Operative RIGHT FEMUR 2 VIEWS COMPARISON:  06/27/2018, 06/23/2011. FINDINGS: Eight spot images from the C-arm fluoroscopic device, AP and LATERAL views of the RIGHT femur obtained during the ORIF procedure are submitted for interpretation post-operatively. ORIF of the comminuted distal RIGHT femoral metadiaphyseal fracture with placement of a new plate laterally. This is in addition to the previously placed intramedullary nail at the time of in intertrochanteric femoral neck fracture in 2012. Alignment appears near anatomic on the operative images. Prior RIGHT knee arthroplasty without complicating features. The radiologic technologist documented 101 seconds of fluoroscopy time. IMPRESSION: ORIF of the comminuted distal RIGHT femoral metadiaphyseal fracture with placement of a new plate laterally. Electronically Signed   By: Hulan Saas M.D.   On: 06/28/2018 11:18   Dg Femur Port, Min 2 Views Right  Result Date: 06/28/2018 CLINICAL DATA:  Fracture fixation EXAM: RIGHT FEMUR PORTABLE 2 VIEW COMPARISON:  06/27/2018 FINDINGS: Acute fracture distal right femur has been fixed with a lateral plate and screws. Total knee replacement. Screws in the right femoral neck and intramedullary rod extend to the distal femur unchanged. Chronic periosteal new bone medial to the proximal right femur is unchanged degenerated related to a healed fracture. IMPRESSION: Lateral plate fixation of distal femoral fracture. Spiral fracture with posterior displacement of the femoral condyle similar to the prior study. Moderate fracture displacement similar to the  prior study. Electronically Signed   By: Marlan Palau M.D.   On: 06/28/2018 11:57        Scheduled Meds: . amLODipine  2.5 mg Oral Daily  . feeding supplement  237 mL Oral BID BM  . insulin aspart  0-9 Units Subcutaneous Q4H  . [START ON 06/29/2018] levothyroxine  125 mcg Oral QAC breakfast  . metoprolol tartrate  12.5 mg Oral BID  . simvastatin  10 mg Oral q1800   Continuous Infusions: . sodium chloride 75 mL/hr at 06/27/18 1849  .  ceFAZolin (ANCEF) IV    . methocarbamol (ROBAXIN) IV       LOS: 1 day    Time spent: 35 minutes.     Alba Cory, MD [facility] Pager [phone]  If 7PM-7AM, please contact night-coverage [email or web address] Password [email or web address] 06/28/2018, 12:42 PM

## 2018-06-28 NOTE — Anesthesia Preprocedure Evaluation (Addendum)
Anesthesia Evaluation  Patient identified by MRN, date of birth, ID band Patient awake    Reviewed: Allergy & Precautions, NPO status , Patient's Chart, lab work & pertinent test results  History of Anesthesia Complications Negative for: history of anesthetic complications  Airway Mallampati: III  TM Distance: <3 FB Neck ROM: Full    Dental  (+) Lower Dentures   Pulmonary neg pulmonary ROS,    breath sounds clear to auscultation       Cardiovascular hypertension, Pt. on medications (-) angina+ CAD and + CABG  + dysrhythmias Atrial Fibrillation  Rhythm:Irregular     Neuro/Psych negative neurological ROS  negative psych ROS   GI/Hepatic negative GI ROS, Neg liver ROS,   Endo/Other  diabetes, Type 2Hypothyroidism   Renal/GU CRFRenal disease     Musculoskeletal  (+) Arthritis , Right periprosthetic fx   Abdominal   Peds  Hematology   Anesthesia Other Findings 4/18 TTE: Left ventricle: The cavity size was normal. Wall thickness was   increased in a pattern of moderate LVH. Systolic function was   normal. The estimated ejection fraction was in the range of 60%   to 65%. Wall motion was normal; there were no regional wall   motion abnormalities. Doppler parameters are consistent with   abnormal left ventricular relaxation (grade 1 diastolic   dysfunction). The E/e&' ratio is between 8-15, suggesting   indeterminate LV filling pressure. - Aortic valve: Trileaflet. Sclerosis without stenosis. There was   no regurgitation. - Mitral valve: Mildly thickened leaflets . There was mild   regurgitation. - Left atrium: Moderately dilated. - Tricuspid valve: There was mild regurgitation. - Pulmonary arteries: PA peak pressure: 26 mm Hg (S). - Inferior vena cava: The vessel was normal in size. The   respirophasic diameter changes were in the normal range (= 50%),   consistent with normal central venous pressure.  Reproductive/Obstetrics                            Anesthesia Physical Anesthesia Plan  ASA: III  Anesthesia Plan: General   Post-op Pain Management:  Regional for Post-op pain   Induction: Intravenous  PONV Risk Score and Plan: 3 and Ondansetron and Dexamethasone  Airway Management Planned: Oral ETT and LMA  Additional Equipment: None  Intra-op Plan:   Post-operative Plan: Extubation in OR  Informed Consent: I have reviewed the patients History and Physical, chart, labs and discussed the procedure including the risks, benefits and alternatives for the proposed anesthesia with the patient or authorized representative who has indicated his/her understanding and acceptance.   Dental advisory given  Plan Discussed with: CRNA and Surgeon  Anesthesia Plan Comments:         Anesthesia Quick Evaluation

## 2018-06-28 NOTE — Transfer of Care (Signed)
Immediate Anesthesia Transfer of Care Note  Patient: [name]  Procedure(s) Performed: OPEN REDUCTION INTERNAL FIXATION (ORIF) DISTAL FEMUR FRACTURE (Right Leg Upper)  Patient Location: PACU  Anesthesia Type:General  Level of Consciousness: awake, alert  and patient cooperative  Airway & Oxygen Therapy: Patient Spontanous Breathing  Post-op Assessment: Report given to RN and Post -op Vital signs reviewed and stable  Post vital signs: Reviewed and stable  Last Vitals:  Vitals Value Taken Time  BP 150/95 06/28/2018  9:44 AM  Temp    Pulse 88 06/28/2018  9:47 AM  Resp 13 06/28/2018  9:47 AM  SpO2 100 % 06/28/2018  9:47 AM  Vitals shown include unvalidated device data.  Last Pain:  Vitals:   06/28/18 0526  TempSrc: Axillary  PainSc:          Complications: No apparent anesthesia complications

## 2018-06-28 NOTE — Consult Note (Signed)
Orthopaedic Trauma Service (OTS) Consult   Patient ID: [name] MRN: [id number] DOB/AGE: 06/11/1926 84 y.o.  Reason for Consult:Right periprosthetic distal femur fracture Referring Physician: Dr. Ophelia Charter, MD Raliegh Ip  HPI: [name] is an 84 y.o. female who is being seen at the request of Dr. Griffin Basil for evaluation of right distal femur fracture.  Patient has a history of coronary artery disease status post CABG, A. fib, thyroidism, hypertension and chronic kidney disease with diet-controlled diabetes.  Patient had a mechanical fall at home.  She had immediate pain and inability to bear weight.  She was found to have a periprosthetic femur fracture at the distal portion of a previous cephalo-medullary nail.  This was between the nail and a total knee replacement.  Dr. Griffin Basil felt that the complexity of the injury and the need for an orthopedic traumatologist to appropriate care for the patient.  Patient ambulates at baseline with a walker.  Currently complaining of pain in the right knee. Patient with significant dementia and limited ambulation.  Past Medical History:  Diagnosis Date  . Bradycardia 09/21/2013   Heart rate of 48 noted on the EKG, while on beta blocker therapy (metoprolol 75 mg twice a day). Medication dosage decrease was undertaken   . CAD (coronary artery disease)   . 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 f/u with Dr Tamala Julian, amlodipine decreased. `E1o3L`discussed with Dr Coletta Memos   . Chronic anticoagulation 05/13/2017  . Coronary artery disease involving coronary bypass graft of native heart with angina pectoris ([place]) 09/21/2013   CABG 1993, LIMA to LAD, SVG to OM2 and 3, SVG to PDA   . Diabetes ([place])   . Diabetes mellitus, type 2 ([place]) 02/15/2014   Last Assessment & Plan:  Diabetes is improving with treatment.  Continue current treatment regimen. Diabetes will be reassessed in 3 months     The goal is  for the Hgb A1C to be less than 7.0.  It is recommended that all diabetics are educated on and follow a healthy diabetic diet, exercise for 30 minutes 3-4 times per week (walking, biking, swimming, or machine), monitor blood glucose readings an  . Essential hypertension, benign 09/21/2013  . H/O deep venous thrombosis 10/23/2012   Overview:  hx of dvt tx 2012 with coumadin following surgery   . Hip fracture ([place])   . History of cardiovascular surgery 10/23/2012   Overview:  1993. CABG> with LIMA to LAD, sequential SVG to oM, SVG to PDA> followed by Dr. Tamala Julian.   Marland Kitchen HTN (hypertension)   . Hypercholesteremia   . Hyperlipidemia   . Hypothyroidism   . Hypothyroidism   . Osteoarthritis   . Osteopenia   . Osteoporosis   . Paroxysmal atrial flutter 05/13/2017    Past Surgical History:  Procedure Laterality Date  . APPENDECTOMY    . CORONARY ARTERY BYPASS GRAFT     with LIMA to LAD,sequential SVG to OM # 1 & 3, SVG to PDA. All grafts patent with normal LV function 2006, followed by cardiologist Dr. Tamala Julian  . EYE SURGERY    . FRACTURE SURGERY    . HEMORRHOID SURGERY    . JOINT REPLACEMENT      Family History  Problem Relation Age of Onset  . Heart disease Mother   . Hyperlipidemia Mother   . Stroke Mother   . Alzheimer's disease Brother     Social History:  reports that she has never smoked. She  has never used smokeless tobacco. She reports that she does not drink alcohol or use drugs.  Allergies:  Allergies  Allergen Reactions  . Ace Inhibitors     Other reaction(s): Other (See Comments) cough    Medications: No current facility-administered medications on file prior to encounter.    Current Outpatient Medications on File Prior to Encounter  Medication Sig Dispense Refill  . aspirin EC 81 MG tablet Take 1 tablet (81 mg total) by mouth daily. 90 tablet 3  . amLODipine (NORVASC) 2.5 MG tablet Take 2.5 mg by mouth daily.    . calcium carbonate 1250 MG capsule Take 1,200 mg by  mouth daily.     Marland Kitchen CINNAMON PO Take 1 tablet by mouth daily.    . Cyanocobalamin (VITAMIN B 12 PO) Take 1,000 mg by mouth daily.    . Flaxseed, Linseed, (FLAX SEED OIL) 1000 MG CAPS Take 1 capsule by mouth daily.    . folic acid (FOLVITE) 628 MCG tablet Take 400 mcg by mouth daily.    . hydrochlorothiazide (HYDRODIURIL) 25 MG tablet Take 25 mg by mouth daily.    Marland Kitchen levothyroxine (SYNTHROID, LEVOTHROID) 100 MCG tablet Take 100 mcg by mouth daily before breakfast.    . MAGNESIUM PO Take 1 tablet by mouth daily.    . metoprolol tartrate (LOPRESSOR) 25 MG tablet Take 0.5 tablets (12.5 mg total) by mouth 2 (two) times daily. 90 tablet 3  . Multiple Vitamins-Minerals (CENTRUM SILVER PO) Take 1 tablet by mouth daily.    . Omega-3 1000 MG CAPS Take by mouth.    . pyridOXINE (VITAMIN B-6) 100 MG tablet Take 100 mg by mouth daily.    . simvastatin (ZOCOR) 10 MG tablet Take 10 mg by mouth daily.  0    ROS: Unable to obtain due to patient's mental status  Exam: Blood pressure 140/61, pulse 64, temperature 97.8 F (36.6 C), temperature source Axillary, resp. rate 16, height '5\' 2"'$  (1.575 m), weight 58.5 kg, SpO2 100 %. General: Alert, no acute distress Cardiovascular: No pedal edema Respiratory: No cyanosis, no use of accessory musculature GI: No organomegaly, abdomen is soft and non-tender Skin: No lesions in the area of chief complaint other than those listed below in MSK exam.  Neurologic: Sensation intact distally save for the below mentioned MSK exam Psychiatric: Patient is competent for consent with normal mood and affect Lymphatic: No axillary or cervical lymphadenopathy Extremities  RLE: patient unable to test ROM at knee  Due to known fracture, wiggling toes, wwp foot, well healed medial parapatellar incision noted.  NVID.    Medical Decision Making: Imaging: X-rays and CT scan reviewed.  There is a cephalo-medullary nail in place with fracture around the distal screws.  Knee arthroplasty  appears to be well fixed.  Labs:  Results for orders placed or performed during the hospital encounter of 06/26/18 (from the past 48 hour(s))  CBC with Differential     Status: Abnormal   Collection Time: 06/27/18  2:35 AM  Result Value Ref Range   WBC 13.0 (H) 4.0 - 10.5 K/uL   RBC 4.16 3.87 - 5.11 MIL/uL   Hemoglobin 12.5 12.0 - 15.0 g/dL   HCT 38.8 36.0 - 46.0 %   MCV 93.3 78.0 - 100.0 fL   MCH 30.0 26.0 - 34.0 pg   MCHC 32.2 30.0 - 36.0 g/dL   RDW 14.5 11.5 - 15.5 %   Platelets 193 150 - 400 K/uL   Neutrophils Relative % 81 %  Neutro Abs 10.5 (H) 1.7 - 7.7 K/uL   Lymphocytes Relative 12 %   Lymphs Abs 1.5 0.7 - 4.0 K/uL   Monocytes Relative 6 %   Monocytes Absolute 0.8 0.1 - 1.0 K/uL   Eosinophils Relative 0 %   Eosinophils Absolute 0.0 0.0 - 0.7 K/uL   Basophils Relative 0 %   Basophils Absolute 0.0 0.0 - 0.1 K/uL   Immature Granulocytes 1 %   Abs Immature Granulocytes 0.1 0.0 - 0.1 K/uL    Comment: Performed at [facility] [address], [place], [place] [address]  Basic metabolic panel     Status: Abnormal   Collection Time: 06/27/18  2:35 AM  Result Value Ref Range   Sodium 142 135 - 145 mmol/L   Potassium 3.9 3.5 - 5.1 mmol/L   Chloride 105 98 - 111 mmol/L   CO2 25 22 - 32 mmol/L   Glucose, Bld 191 (H) 70 - 99 mg/dL   BUN 34 (H) 8 - 23 mg/dL   Creatinine, Ser 1.16 (H) 0.44 - 1.00 mg/dL   Calcium 9.2 8.9 - 10.3 mg/dL   GFR calc non Af Amer 40 (L) >60 mL/min   GFR calc Af Amer 47 (L) >60 mL/min    Comment: (NOTE) The eGFR has been calculated using the CKD EPI equation. This calculation has not been validated in all clinical situations. eGFR's persistently <60 mL/min signify possible Chronic Kidney Disease.    Anion gap 12 5 - 15    Comment: Performed at [facility] [address], [place], [place] [address]  Type and screen [place]     Status: None   Collection Time: 06/27/18  2:35 AM  Result Value Ref Range    ABO/RH(D) O POS    Antibody Screen NEG    Sample Expiration      06/30/2018 Performed at [facility], [place] [address] [place], [place] [address]   TSH     Status: Abnormal   Collection Time: 06/27/18  4:11 AM  Result Value Ref Range   TSH 27.292 (H) 0.350 - 4.500 uIU/mL    Comment: Performed by a 3rd Generation assay with a functional sensitivity of <=0.01 uIU/mL. Performed at [facility], [place] [address], [place], [place] [address]   CBG monitoring, ED     Status: Abnormal   Collection Time: 06/27/18  4:24 AM  Result Value Ref Range   Glucose-Capillary 180 (H) 70 - 99 mg/dL  Glucose, capillary     Status: Abnormal   Collection Time: 06/27/18  5:27 AM  Result Value Ref Range   Glucose-Capillary 171 (H) 70 - 99 mg/dL  Glucose, capillary     Status: Abnormal   Collection Time: 06/27/18  7:43 AM  Result Value Ref Range   Glucose-Capillary 146 (H) 70 - 99 mg/dL  Protime-INR     Status: None   Collection Time: 06/27/18  8:23 AM  Result Value Ref Range   Prothrombin Time 13.6 11.4 - 15.2 seconds   INR 1.05     Comment: Performed at [facility], [place] [address], [place], [place] [address]  Glucose, capillary     Status: Abnormal   Collection Time: 06/27/18 11:45 AM  Result Value Ref Range   Glucose-Capillary 161 (H) 70 - 99 mg/dL  Glucose, capillary     Status: Abnormal   Collection Time: 06/27/18  4:23 PM  Result Value Ref Range   Glucose-Capillary  232 (H) 70 - 99 mg/dL  Glucose, capillary     Status: Abnormal   Collection Time: 06/28/18  5:04 AM  Result Value Ref Range   Glucose-Capillary 130 (H) 70 - 99 mg/dL    Medical history and chart was reviewed  Assessment/Plan: 84 year old female with a history of coronary artery disease, A. fib, diabetes, and chronic kidney disease with right periprosthetic distal femur fracture  To allow patient to ambulate again I recommend proceed with open reduction internal fixation.  We also probably remove the distal 2  interlocking screws and place a plate.  Risks and benefits were discussed with the patient's family.Risks discussed included bleeding requiring blood transfusion, bleeding causing a hematoma, infection, malunion, nonunion, damage to surrounding nerves and blood vessels, pain, hardware prominence or irritation, hardware failure, stiffness, post-traumatic arthritis, DVT/PE, compartment syndrome, and even death. Plan to proceed with surgery.   Shona Needles, MD [facility] [phone] (phone)

## 2018-06-28 NOTE — Anesthesia Procedure Notes (Addendum)
Procedure Name: Intubation Date/Time: 06/28/2018 7:55 AM Performed by: Wilder Glade, CRNA Pre-anesthesia Checklist: Patient identified, Emergency Drugs available, Suction available, Patient being monitored and Timeout performed Patient Re-evaluated:Patient Re-evaluated prior to induction Oxygen Delivery Method: Circle system utilized Preoxygenation: Pre-oxygenation with 100% oxygen Induction Type: IV induction Ventilation: Mask ventilation without difficulty Laryngoscope Size: Miller and 2 Grade View: Grade II Tube type: Oral Tube size: 7.0 mm Number of attempts: 1 Airway Equipment and Method: Stylet Placement Confirmation: ETT inserted through vocal cords under direct vision,  positive ETCO2 and breath sounds checked- equal and bilateral Secured at: 21 cm Tube secured with: Tape Dental Injury: Teeth and Oropharynx as per pre-operative assessment  Comments: Patient unwilling to take out lower denture per family and patient prior to going to the OR, upon induction of anesthesia and careful attention to lower dentition inspection and dvl revealed NO partial plate in the lower jaw

## 2018-06-28 NOTE — Progress Notes (Signed)
Pt still combative and refusing care even with family in the room. Attempted CBGs multiple times, pt keeps pulling tele monitor off stating she does not want it on and for [place] to leave the room. Hospitalist on call made aware. Pt is refusing to take any PO meds. Ativan given per Hosptialist on call.    2200 Pt is now sleeping. Grandson still in room with pt.

## 2018-06-28 NOTE — Op Note (Signed)
OrthopaedicSurgeryOperativeNote ([id number]) Date of Surgery: 06/28/2018  Admit Date: 06/26/2018   Diagnoses: Pre-Op Diagnoses: Right periprosthetic distal femur fracture   Post-Op Diagnosis: Same  Procedures: 1. CPT 27511-Open reduction internal fixation of right distal femur 2. CPT 20680-Removal of hardware right femur  Surgeons: Primary: Roby Lofts, MD   Location:[facility] OR ROOM 03   AnesthesiaGeneral   Antibiotics:Ancef 2g preop   Tourniquettime:* No tourniquets in log * .  EstimatedBloodLoss:50 mL   Complications:None  Specimens:None  Implants: Implant Name Type Inv. Item Serial No. Manufacturer Lot No. LRB No. Used Action  PLATE FEM DIST NCB PP - [id number] Plate PLATE FEM DIST NCB PP [id number] [employer](ORTH,TRAU,[employer],SG) [id number] Right 1 Implanted  SCREW NCB 5.0X75MM - [id number] Screw SCREW NCB 5.0X75MM [id number] [employer](ORTH,TRAU,BIO,SG) [id number] Right 2 Implanted  SCREW 5.0 - [id number] Screw SCREW 5.0 [id number] [employer](ORTH,TRAU,BIO,SG) [id number] Right 2 Implanted  SCREW NCB 4.0X40MM - [id number] Screw SCREW NCB 4.0X40MM [id number] [employer](ORTH,TRAU,BIO,SG) [id number] Right 1 Implanted  SCREW NCB 4.0MX42M - [id number] Screw SCREW NCB 4.0MX42M [id number] [employer](ORTH,TRAU,BIO,SG) [id number] Right 1 Implanted  SCREW 5.0 - [id number] Screw SCREW 5.0  [employer](ORTH,TRAU,BIO,SG)  Right 1 Implanted  SCREW NCB 4.3IR44R - [id number] Screw SCREW NCB 4.1PJ09T  [employer](ORTH,TRAU,BIO,SG)  Right 1 Implanted    IndicationsforSurgery: 84 year old female with a history of dementia as well as coronary artery disease.  She had a mechanical fall at home where she sustained a periprosthetic distal femur fracture around her previous intramedullary nail of the right femur above her previous total knee replacement.  I was asked to take over her care due to complexity of her injury and need for an  orthopedic traumatologist.  I discussed risks and benefits of proceeding with surgical fixation with her daughter.  I felt that she would not be able to mobilize or ambulate if we did not fix this. Risks discussed included bleeding requiring blood transfusion, bleeding causing a hematoma, infection, malunion, nonunion, damage to surrounding nerves and blood vessels, pain, hardware prominence or irritation, hardware failure, stiffness, post-traumatic arthritis, DVT/PE, compartment syndrome, and even death.  Patient's daughter agreed to proceed with surgery and consent was obtained.  Operative Findings: 1.  Removal of distal interlocking screws from right cephalo-medullary nail 2.  Open reduction internal fixation of right periprosthetic distal femur fracture using [employer] NCB 12 hole plate  Procedure: The patient was identified in the preoperative holding area. Consent was confirmed with the family and all questions were answered. The operative extremity was marked after confirmation with the family. The patientwas then brought back to the operating room by our anesthesia colleagues. The patient was placed under general anesthesia and was carefully transferred over to a radiolucent flattop table. They were secured to the bed and all bony prominences were padded.  A bump was placed under the operative hip. The operative extremity was then prepped and draped in usual sterile fashion. A timeout was performed to verify the patient, the procedure and extremity. Preoperative antibiotics were dosed.  Fluoroscopy was used to identify the fracture. A lateral approach was made to the distal femur. It was taken down through the skin and the IT band was incised in line with the incision. The distal portion of the vastus lateralis was reflected off the IT band and the distal articular block of the lateral femoral condyle was exposed.  I proceeded to remove the distal interlocking screws without difficulty.  An  attempt was made to keep the fracture  from being stripped or devitalized. I used fluoroscopy to identify the correct length plate to bridge the whole femoral shaft and proximal to the tip of the hip prothesis. I chose a 12-hole plate. The aiming arm was attached to the plate and slide submuscularly under the vastus to the proximal femur. The distal portion of the plate was placed flush against the lateral cortex of the distal articular block. A 3.93mm drill bit was used to position the proximal portion of the plate.  I was able to place the 3.3 mm drill bit posterior to the nail gaining unicortical purchase.  A perfect lateral was obtained to show appropriate positioning of the plate.  The distal segment was drilled and 5.61mm cortical screws were placed in the distal fracture segment. A total of 2 screws were placed initially. A 4.0 mm screw was placed percutaneously in the shaft posterior to the nail to correct the coronal alignment.  Another 4.0 millimeter screw was placed posterior to the nail in the shaft.  I excepted some mild reduction at the fracture site to allow for fixation posterior to the nail with 3 screws.  I then placed 3 more 5.0 millimeter screws distally.  2 unicortical locking screws were placed into the shaft as well.  Locking caps were placed on all but the most proximal screw.  The incisions were irrigated with normal saline. A gram of vancomycin powder was placed in the incision around the plate. The IT band was closed with 0-vicryl. The skin was closed with 2-vicryl, 3-0 nylon. The incisions were dressed with a Mepilex dressings. The patient was then transferred to the regular floor bed and taken to the PACU in stable condition.  Post Op Plan/Instructions: The patient will be weightbearing as tolerated to the right lower extremity.  She will receive postoperative Ancef.  DVT prophylaxis will be at the discretion of primary team.  I recommend either full dose aspirin twice daily or  Lovenox.  I was present and performed the entire surgery.  Truitt Merle, MD [facility]

## 2018-06-28 NOTE — Progress Notes (Addendum)
At beginning of my shift pt was very confused and very restless. Pt could not state her name or birthday. Spoke with pt's daughter regarding need for verbal consent once order in from MD. Daughter voiced understanding. Daughter also stated she would have pt's grandson come and sit with the patient.

## 2018-06-29 ENCOUNTER — Other Ambulatory Visit: Payer: Self-pay

## 2018-06-29 ENCOUNTER — Encounter (HOSPITAL_COMMUNITY): Payer: Self-pay | Admitting: *Deleted

## 2018-06-29 LAB — CBC
HCT: 26.3 % — ABNORMAL LOW (ref 36.0–46.0)
Hemoglobin: 8.4 g/dL — ABNORMAL LOW (ref 12.0–15.0)
MCH: 29.9 pg (ref 26.0–34.0)
MCHC: 31.9 g/dL (ref 30.0–36.0)
MCV: 93.6 fL (ref 78.0–100.0)
Platelets: 145 10*3/uL — ABNORMAL LOW (ref 150–400)
RBC: 2.81 MIL/uL — ABNORMAL LOW (ref 3.87–5.11)
RDW: 14.2 % (ref 11.5–15.5)
WBC: 11.4 10*3/uL — ABNORMAL HIGH (ref 4.0–10.5)

## 2018-06-29 LAB — GLUCOSE, CAPILLARY
Glucose-Capillary: 144 mg/dL — ABNORMAL HIGH (ref 70–99)
Glucose-Capillary: 151 mg/dL — ABNORMAL HIGH (ref 70–99)
Glucose-Capillary: 154 mg/dL — ABNORMAL HIGH (ref 70–99)
Glucose-Capillary: 170 mg/dL — ABNORMAL HIGH (ref 70–99)
Glucose-Capillary: 175 mg/dL — ABNORMAL HIGH (ref 70–99)
Glucose-Capillary: 213 mg/dL — ABNORMAL HIGH (ref 70–99)

## 2018-06-29 LAB — BASIC METABOLIC PANEL
Anion gap: 9 (ref 5–15)
BUN: 21 mg/dL (ref 8–23)
CO2: 25 mmol/L (ref 22–32)
Calcium: 8.3 mg/dL — ABNORMAL LOW (ref 8.9–10.3)
Chloride: 106 mmol/L (ref 98–111)
Creatinine, Ser: 1.09 mg/dL — ABNORMAL HIGH (ref 0.44–1.00)
GFR calc Af Amer: 51 mL/min — ABNORMAL LOW (ref >60)
GFR calc non Af Amer: 44 mL/min — ABNORMAL LOW (ref >60)
Glucose, Bld: 181 mg/dL — ABNORMAL HIGH (ref 70–99)
Potassium: 3.9 mmol/L (ref 3.5–5.1)
Sodium: 140 mmol/L (ref 135–145)

## 2018-06-29 LAB — MAGNESIUM: Magnesium: 1.9 mg/dL (ref 1.7–2.4)

## 2018-06-29 MED ORDER — ASPIRIN 325 MG PO TABS
325.0000 mg | ORAL_TABLET | Freq: Every day | ORAL | Status: DC
  Administered 2018-06-29 – 2018-06-30 (×2): 325 mg via ORAL
  Filled 2018-06-29 (×2): qty 1

## 2018-06-29 MED ORDER — HYDROCODONE-ACETAMINOPHEN 5-325 MG PO TABS: 1.0000 | ORAL_TABLET | Freq: Four times a day (QID) | ORAL | Status: DC | PRN

## 2018-06-29 MED ORDER — MORPHINE SULFATE (PF) 2 MG/ML IV SOLN
0.5000 mg | INTRAVENOUS | Status: DC | PRN
  Administered 2018-07-01: 0.5 mg via INTRAVENOUS
  Filled 2018-06-29: qty 1

## 2018-06-29 MED ORDER — SENNOSIDES-DOCUSATE SODIUM 8.6-50 MG PO TABS
1.0000 | ORAL_TABLET | Freq: Two times a day (BID) | ORAL | Status: DC
  Administered 2018-06-29 – 2018-07-01 (×5): 1 via ORAL
  Filled 2018-06-29 (×5): qty 1

## 2018-06-29 MED ORDER — SENNOSIDES-DOCUSATE SODIUM 8.6-50 MG PO TABS: 1.0000 | ORAL_TABLET | Freq: Every day | ORAL | Status: DC

## 2018-06-29 MED ORDER — POLYETHYLENE GLYCOL 3350 17 G PO PACK
17.0000 g | PACK | Freq: Two times a day (BID) | ORAL | Status: DC
  Administered 2018-06-29 – 2018-07-01 (×5): 17 g via ORAL
  Filled 2018-06-29 (×5): qty 1

## 2018-06-29 MED ORDER — FERROUS SULFATE 325 (65 FE) MG PO TABS
325.0000 mg | ORAL_TABLET | Freq: Two times a day (BID) | ORAL | Status: DC
  Administered 2018-06-29 – 2018-07-01 (×4): 325 mg via ORAL
  Filled 2018-06-29 (×5): qty 1

## 2018-06-29 NOTE — Evaluation (Signed)
Physical Therapy Evaluation Patient Details Name: [name] MRN: [id number] DOB: 1926-09-03 Today's Date: 06/29/2018   History of Present Illness  [name] is a 84 y.o. female with medical history significant for coronary artery disease status post CABG, paroxysmal atrial flutter no longer on anticoagulation due to recurrent falls, hypothyroidism, hypertension, chronic kidney disease stage III, and diet-controlled diabetes, now presenting to the emergency department with right knee pain after a fall at home. Underwent ORIF for R distal femur fx on 06/28/18.  Clinical Impression  Pt admitted with above diagnosis. Pt currently with functional limitations due to the deficits listed below (see PT Problem List). Pt confused at baseline and oriented only to self. She is very agreeable to mobility though. Max A +2 for bed mobility and transfer to chair, was unable to ambulate today due to RLE pain. Expect she will progress well as pain decreases.  Pt will benefit from skilled PT to increase their independence and safety with mobility to allow discharge to the venue listed below.       Follow Up Recommendations SNF;Supervision/Assistance - 24 hour    Equipment Recommendations  None recommended by PT    Recommendations for Other Services       Precautions / Restrictions Precautions Precautions: Fall Restrictions Weight Bearing Restrictions: Yes RLE Weight Bearing: Weight bearing as tolerated      Mobility  Bed Mobility Overal bed mobility: Needs Assistance Bed Mobility: Supine to Sit     Supine to sit: Max assist     General bed mobility comments: max A for LE's off bed and elevation of trunk to sitting, painful with mvmt  Transfers Overall transfer level: Needs assistance   Transfers: Sit to/from Stand;Stand Pivot Transfers Sit to Stand: Max assist;+2 physical assistance Stand pivot transfers: Max assist;+2 physical assistance       General transfer comment: pt  did place RLE on floor but would not take enough wt on it to move LLE. Slid feet to turn to chair, needed facilitation at hips  Ambulation/Gait             General Gait Details: unable today  Stairs            Wheelchair Mobility    Modified Rankin (Stroke Patients Only)       Balance Overall balance assessment: Needs assistance;History of Falls Sitting-balance support: Single extremity supported Sitting balance-Leahy Scale: Fair Sitting balance - Comments: able to maintain static stance EOB   Standing balance support: Bilateral upper extremity supported Standing balance-Leahy Scale: Zero Standing balance comment: needed max A +2 to stand                             Pertinent Vitals/Pain Pain Assessment: Faces Faces Pain Scale: Hurts little more Pain Location: RLE Pain Descriptors / Indicators: Operative site guarding;Sore Pain Intervention(s): Limited activity within patient's tolerance;Monitored during session    Home Living Family/patient expects to be discharged to:: Skilled nursing facility Living Arrangements: Children               Additional Comments: pt very confused, unable to give any history, lives with daughter per nurse    Prior Function Level of Independence: Needs assistance   Gait / Transfers Assistance Needed: was ambulatory AD  ADL's / Homemaking Assistance Needed: needed assist due to cognition        Hand Dominance        Extremity/Trunk Assessment  Upper Extremity Assessment Upper Extremity Assessment: Defer to OT evaluation    Lower Extremity Assessment Lower Extremity Assessment: RLE deficits/detail RLE Deficits / Details: unable to lift RLE against gravity due to pain. Knee ext 2-/5 RLE: Unable to fully assess due to pain RLE Sensation: WNL RLE Coordination: decreased gross motor;decreased fine motor    Cervical / Trunk Assessment Cervical / Trunk Assessment: Normal  Communication    Communication: No difficulties;HOH  Cognition Arousal/Alertness: Awake/alert Behavior During Therapy: WFL for tasks assessed/performed Overall Cognitive Status: History of cognitive impairments - at baseline                                 General Comments: pt oriented only to self      General Comments General comments (skin integrity, edema, etc.): pt's daughter arrived after session and noted that pt's mental status is her baseline. She is agreeable to SNF for rehab and is hoping for [facility] because her mom's sister is already there.     Exercises General Exercises - Lower Extremity Long Arc Quad: Right;5 reps;[facility]   Assessment/Plan    PT Assessment Patient needs continued PT services  PT Problem List Decreased strength;Decreased range of motion;Decreased activity tolerance;Decreased balance;Decreased mobility;Decreased coordination;Decreased cognition;Decreased knowledge of use of DME;Decreased knowledge of precautions;Decreased safety awareness;Pain       PT Treatment Interventions DME instruction;Gait training;Functional mobility training;Therapeutic activities;Therapeutic exercise;Balance training;Cognitive remediation;Patient/family education;Neuromuscular re-education    PT Goals (Current goals can be found in the Care Plan section)  Acute Rehab PT Goals Patient Stated Goal: none stated PT Goal Formulation: With patient/family Time For Goal Achievement: 07/13/18 Potential to Achieve Goals: Good    Frequency Min 3X/week   Barriers to discharge        Co-evaluation PT/OT/SLP Co-Evaluation/Treatment: Yes Reason for Co-Treatment: Necessary to address cognition/behavior during functional activity;For patient/therapist safety PT goals addressed during session: Mobility/safety with mobility;Balance         AM-PAC PT "6 Clicks" Daily Activity  Outcome Measure Difficulty turning over in bed (including adjusting bedclothes, sheets and  blankets)?: Unable Difficulty moving from lying on back to sitting on the side of the bed? : Unable Difficulty sitting down on and standing up from a chair with arms (e.g., wheelchair, bedside commode, etc,.)?: Unable Help needed moving to and from a bed to chair (including a wheelchair)?: A Lot Help needed walking in hospital room?: Total Help needed climbing 3-5 steps with a railing? : Total 6 Click Score: 7    End of Session Equipment Utilized During Treatment: Gait belt Activity Tolerance: Patient tolerated treatment well Patient left: in chair;with call bell/phone within reach;with chair alarm set;with family/visitor present Nurse Communication: Mobility status PT Visit Diagnosis: Unsteadiness on feet (R26.81);Repeated falls (R29.6);Pain;Difficulty in walking, not elsewhere classified (R26.2) Pain - Right/Left: Right Pain - part of body: Knee    Time: 6962-9528 PT Time Calculation (min) (ACUTE ONLY): 25 min   Charges:   PT Evaluation $PT Eval Moderate Complexity: 1 Mod          [place], PT  Acute Rehab Services  [phone]   Lawana Chambers Jordanna Hendrie 06/29/2018, 9:24 AM

## 2018-06-29 NOTE — Progress Notes (Signed)
Orthopaedic Trauma Progress Note  S: Patient is sleeping.  Unable to answer questions appropriately.  Appears confused.  O:  Vitals:   06/29/18 0443 06/29/18 0600  BP: (!) 144/61   Pulse: 70   Resp: 16   Temp: 98.3 F (36.8 C)   SpO2: 100% 95%   No acute distress.  She is resting comfortably.  Right lower extremity: Dressings are clean dry and intact.  Knee immobilizer was in place I removed this.  Compartments are soft and compressible.  Active dorsiflexion plantarflexion of her ankle.  Unable to respond appropriately for sensation questions.  Imaging: Stable post op imaging  Labs:  Results for orders placed or performed during the hospital encounter of 06/26/18 (from the past 24 hour(s))  Glucose, capillary     Status: Abnormal   Collection Time: 06/28/18  9:47 AM  Result Value Ref Range   Glucose-Capillary 139 (H) 70 - 99 mg/dL  Glucose, capillary     Status: Abnormal   Collection Time: 06/28/18 11:23 AM  Result Value Ref Range   Glucose-Capillary 156 (H) 70 - 99 mg/dL  Basic metabolic panel     Status: Abnormal   Collection Time: 06/28/18 12:58 PM  Result Value Ref Range   Sodium 141 135 - 145 mmol/L   Potassium 4.2 3.5 - 5.1 mmol/L   Chloride 105 98 - 111 mmol/L   CO2 27 22 - 32 mmol/L   Glucose, Bld 164 (H) 70 - 99 mg/dL   BUN 20 8 - 23 mg/dL   Creatinine, Ser 5.46 0.44 - 1.00 mg/dL   Calcium 8.8 (L) 8.9 - 10.3 mg/dL   GFR calc non Af Amer 51 (L) >60 mL/min   GFR calc Af Amer 59 (L) >60 mL/min   Anion gap 9 5 - 15  Magnesium     Status: None   Collection Time: 06/28/18 12:58 PM  Result Value Ref Range   Magnesium 2.0 1.7 - 2.4 mg/dL  Glucose, capillary     Status: Abnormal   Collection Time: 06/28/18  4:20 PM  Result Value Ref Range   Glucose-Capillary 194 (H) 70 - 99 mg/dL  Glucose, capillary     Status: Abnormal   Collection Time: 06/28/18  8:34 PM  Result Value Ref Range   Glucose-Capillary 168 (H) 70 - 99 mg/dL  Glucose, capillary     Status: Abnormal    Collection Time: 06/28/18 11:59 PM  Result Value Ref Range   Glucose-Capillary 175 (H) 70 - 99 mg/dL  Glucose, capillary     Status: Abnormal   Collection Time: 06/29/18  3:38 AM  Result Value Ref Range   Glucose-Capillary 154 (H) 70 - 99 mg/dL  CBC     Status: Abnormal   Collection Time: 06/29/18  3:56 AM  Result Value Ref Range   WBC 11.4 (H) 4.0 - 10.5 K/uL   RBC 2.81 (L) 3.87 - 5.11 MIL/uL   Hemoglobin 8.4 (L) 12.0 - 15.0 g/dL   HCT 56.8 (L) 12.7 - 51.7 %   MCV 93.6 78.0 - 100.0 fL   MCH 29.9 26.0 - 34.0 pg   MCHC 31.9 30.0 - 36.0 g/dL   RDW 00.1 74.9 - 44.9 %   Platelets 145 (L) 150 - 400 K/uL  Basic metabolic panel     Status: Abnormal   Collection Time: 06/29/18  3:56 AM  Result Value Ref Range   Sodium 140 135 - 145 mmol/L   Potassium 3.9 3.5 - 5.1 mmol/L   Chloride  106 98 - 111 mmol/L   CO2 25 22 - 32 mmol/L   Glucose, Bld 181 (H) 70 - 99 mg/dL   BUN 21 8 - 23 mg/dL   Creatinine, Ser 3.08 (H) 0.44 - 1.00 mg/dL   Calcium 8.3 (L) 8.9 - 10.3 mg/dL   GFR calc non Af Amer 44 (L) >60 mL/min   GFR calc Af Amer 51 (L) >60 mL/min   Anion gap 9 5 - 15  Glucose, capillary     Status: Abnormal   Collection Time: 06/29/18  7:28 AM  Result Value Ref Range   Glucose-Capillary 151 (H) 70 - 99 mg/dL    Assessment: 84 year old female s/p mechanical fall  Injuries: Right periprosthetic distal femur fracture  Weightbearing: WBAT RLE  Insicional and dressing care: Dressing change POD2  Orthopedic device(s):None needed, patient does not need knee immobilizer  CV/Blood loss: Hgb 8.4 this AM down from 12.5, acute blood loss anemia and dilution from IVF. Continue to monitor, currently hemodynamically stable  Pain management: 1. Norco 5/325mg  q 6hours PRN 2. Morphine 0.5mg  q 2hr PRN pain 3. Tylenol 650 mg q 6hrs PRN pain  Attempted to limit narcotic meds due to dementia  VTE prophylaxis: Would recommend Aspirin 325 mg daily  ID: Ancef 2 gm postoperatively  Foley/Lines: No  foley, NS at 75 ml/hr  Medical co-morbidities: 1. A-fib-no anticoagulation, currently on metoprolol 2. Hypothyroidism-Continue synthroid 3. Diabetes-sliding scale insulin 4. Dementia  Impediments to Fracture Healing: 1. Osteoporosis 2. hypothyroidism  Dispo: PT/OT eval likely SNF  Follow - up plan: Follow up with me in 2 weeks for suture removal and x-rays  Roby Lofts, MD [facility] [phone] (phone)

## 2018-06-29 NOTE — Anesthesia Postprocedure Evaluation (Signed)
Anesthesia Post Note  Patient: JERISSA SHANOR  Procedure(s) Performed: OPEN REDUCTION INTERNAL FIXATION (ORIF) DISTAL FEMUR FRACTURE (Right Leg Upper)     Patient location during evaluation: PACU Anesthesia Type: General Level of consciousness: awake and alert Pain management: pain level controlled Vital Signs Assessment: post-procedure vital signs reviewed and stable Respiratory status: spontaneous breathing, nonlabored ventilation, respiratory function stable and patient connected to nasal cannula oxygen Cardiovascular status: blood pressure returned to baseline and stable Postop Assessment: no apparent nausea or vomiting Anesthetic complications: no    Last Vitals:  Vitals:   06/29/18 0346 06/29/18 0443  BP:  (!) 144/61  Pulse:  70  Resp:  16  Temp:  36.8 C  SpO2: 97% 100%    Last Pain:  Vitals:   06/29/18 0443  TempSrc: Oral  PainSc:                  Deaunte Dente

## 2018-06-29 NOTE — Progress Notes (Signed)
Pt has been awake most of the night. She is only oriented to self.  At 0400 she was trying to get out of the bed and had pulled off her gown and most of her covers. She was assisted to the bedside commode and did quite well with a two person assist. One norco given for her pain, and then she fell asleep.

## 2018-06-29 NOTE — Progress Notes (Signed)
PROGRESS NOTE    [name]  WRU:[id number] DOB: Jan 26, 1926 DOA: 06/26/2018 PCP: Henrine Screws, MD    Brief Narrative:[name] is a 84 y.o. female with medical history significant for coronary artery disease status post CABG, paroxysmal atrial flutter no longer on anticoagulation due to recurrent falls, hypothyroidism, hypertension, chronic kidney disease stage III, and diet-controlled diabetes, now presenting to the emergency department with right knee pain after a fall at home.  Patient had reportedly been in her usual state of health with no complaints or recent illness when she fell at home last night.  She denies hitting her head or losing consciousness, but experienced immediate and severe pain at the right knee.  EMS was called and she was treated with fentanyl prior to arrival in the ED.  ED Course: Upon arrival to the ED, patient is found to be afebrile, saturating well on room air, hypertensive, and with vitals otherwise normal.  EKG features a sinus rhythm with QTc interval of 580 ms.  Chest x-ray is notable for cardiomegaly, but no acute cardiopulmonary disease.  Noncontrast head CT is negative for acute intracranial abnormality and cervical spine CT is negative for acute pathology.  Radiographs of the pelvis are negative for acute fracture and right femur films reveal mildly displaced fracture involving the mid to distal right femur.  Orthopedic surgery was consulted and recommended medical admission with the patient to be kept n.p.o.   Assessment & Plan:   Principal Problem:   Closed fracture of distal end of right femur (HCC) Active Problems:   Coronary artery disease involving coronary bypass graft of native heart with angina pectoris (HCC)   Essential hypertension, benign   Adult hypothyroidism   Diabetes mellitus, type 2 (HCC)   Paroxysmal atrial flutter   CKD (chronic kidney disease), stage III (HCC)   Prolonged QT interval   Closed fracture of right  femur, initial encounter (HCC)  1-Right femur fracture;  Open reduction and fixation of right distal femur. Removal of hardware of right femur.  Post sx 9-01.  Pain management.  DVT per ortho; now on aspirin 325.  Start bowel regimen. Miralax.  PT recommending SNF  A fib;  not on anticoagulation.  Continue with metoprolol.   Acute blood loss anemia.  Post sx hb decrease from 12 to 8.  Repeat levels in am.   Prolong Qt  Repeat EKG improved QT   Hypothyroidism; continue with synthroid. Increase dose TSH at 26   DM; SSI.   CKD stage III;  Monitor Monitor renal function.   Delirium;  In setting of acute illness.   HTN; hold Norvasc. SBP soft.   Episode of bradycardia; while asleep.  holder parameter for metoprolol.   DVT prophylaxis: per ortho Code Status: full code.  Family Communication: no family at bedside.  Disposition Plan: remain in hospital.   Consultants:   ortho   Procedures:   sx   Antimicrobials:   none   Subjective: Sitting recliner. Pain controlled. No BM. Denies dyspnea. daughter at bedside.    Objective: Vitals:   06/29/18 0346 06/29/18 0443 06/29/18 0600 06/29/18 1110  BP:  (!) 144/61  103/61  Pulse:  70  (!) 59  Resp:  16    Temp:  98.3 F (36.8 C)    TempSrc:  Oral    SpO2: 97% 100% 95%   Weight:      Height:        Intake/Output Summary (Last 24 hours) at 06/29/2018 1250 Last  data filed at 06/29/2018 0500 Gross per 24 hour  Intake 2735 ml  Output 200 ml  Net 2535 ml   Filed Weights   06/26/18 2313  Weight: 58.5 kg    Examination:  General exam: NAD Respiratory system: CTA Cardiovascular system: S 1, S 2 RRR Gastrointestinal system: BS present, soft, nt Central nervous system: alert, follows command.  Extremities: Right LE with clean dressing  Skin: No rashes, lesions or ulcers   Data Reviewed: I have personally reviewed following labs and imaging studies  CBC: Recent Labs  Lab 06/27/18 0235  06/29/18 0356  WBC 13.0* 11.4*  NEUTROABS 10.5*  --   HGB 12.5 8.4*  HCT 38.8 26.3*  MCV 93.3 93.6  PLT 193 145*   Basic Metabolic Panel: Recent Labs  Lab 06/27/18 0235 06/28/18 1258 06/29/18 0356  NA 142 141 140  K 3.9 4.2 3.9  CL 105 105 106  CO2 25 27 25   GLUCOSE 191* 164* 181*  BUN 34* 20 21  CREATININE 1.16* 0.96 1.09*  CALCIUM 9.2 8.8* 8.3*  MG  --  2.0 1.9   GFR: Estimated Creatinine Clearance: 27.7 mL/min (A) (by C-G formula based on SCr of 1.09 mg/dL (H)). Liver Function Tests: No results for input(s): AST, ALT, ALKPHOS, BILITOT, PROT, ALBUMIN in the last 168 hours. No results for input(s): LIPASE, AMYLASE in the last 168 hours. No results for input(s): AMMONIA in the last 168 hours. Coagulation Profile: Recent Labs  Lab 06/27/18 0823  INR 1.05   Cardiac Enzymes: No results for input(s): CKTOTAL, CKMB, CKMBINDEX, TROPONINI in the last 168 hours. BNP (last 3 results) No results for input(s): PROBNP in the last 8760 hours. HbA1C: No results for input(s): HGBA1C in the last 72 hours. CBG: Recent Labs  Lab 06/28/18 2034 06/28/18 2359 06/29/18 0338 06/29/18 0728 06/29/18 1129  GLUCAP 168* 175* 154* 151* 170*   Lipid Profile: No results for input(s): CHOL, HDL, LDLCALC, TRIG, CHOLHDL, LDLDIRECT in the last 72 hours. Thyroid Function Tests: Recent Labs    06/27/18 0411  TSH 27.292*   Anemia Panel: No results for input(s): VITAMINB12, FOLATE, FERRITIN, TIBC, IRON, RETICCTPCT in the last 72 hours. Sepsis Labs: No results for input(s): PROCALCITON, LATICACIDVEN in the last 168 hours.  Recent Results (from the past 240 hour(s))  MRSA PCR Screening     Status: None   Collection Time: 06/28/18  5:13 AM  Result Value Ref Range Status   MRSA by PCR NEGATIVE NEGATIVE Final    Comment:        The GeneXpert MRSA Assay (FDA approved for NASAL specimens only), is one component of a comprehensive MRSA colonization surveillance program. It is  not intended to diagnose MRSA infection nor to guide or monitor treatment for MRSA infections. Performed at [facility], [address], [place], [place] [address]          Radiology Studies: Dg C-arm 1-60 Min  Result Date: 06/28/2018 CLINICAL DATA:  ORIF of a distal RIGHT femur fracture. EXAM: Operative RIGHT FEMUR 2 VIEWS COMPARISON:  06/27/2018, 06/23/2011. FINDINGS: Eight spot images from the C-arm fluoroscopic device, AP and LATERAL views of the RIGHT femur obtained during the ORIF procedure are submitted for interpretation post-operatively. ORIF of the comminuted distal RIGHT femoral metadiaphyseal fracture with placement of a new plate laterally. This is in addition to the previously placed intramedullary nail at the time of in intertrochanteric femoral neck fracture in 2012. Alignment appears near anatomic on the operative images. Prior  RIGHT knee arthroplasty without complicating features. The radiologic technologist documented 101 seconds of fluoroscopy time. IMPRESSION: ORIF of the comminuted distal RIGHT femoral metadiaphyseal fracture with placement of a new plate laterally. Electronically Signed   By: Hulan Saas M.D.   On: 06/28/2018 11:18   Dg Femur, Min 2 Views Right  Result Date: 06/28/2018 CLINICAL DATA:  ORIF of a distal RIGHT femur fracture. EXAM: Operative RIGHT FEMUR 2 VIEWS COMPARISON:  06/27/2018, 06/23/2011. FINDINGS: Eight spot images from the C-arm fluoroscopic device, AP and LATERAL views of the RIGHT femur obtained during the ORIF procedure are submitted for interpretation post-operatively. ORIF of the comminuted distal RIGHT femoral metadiaphyseal fracture with placement of a new plate laterally. This is in addition to the previously placed intramedullary nail at the time of in intertrochanteric femoral neck fracture in 2012. Alignment appears near anatomic on the operative images. Prior RIGHT knee arthroplasty without complicating features. The radiologic  technologist documented 101 seconds of fluoroscopy time. IMPRESSION: ORIF of the comminuted distal RIGHT femoral metadiaphyseal fracture with placement of a new plate laterally. Electronically Signed   By: Hulan Saas M.D.   On: 06/28/2018 11:18   Dg Femur Port, Min 2 Views Right  Result Date: 06/28/2018 CLINICAL DATA:  Fracture fixation EXAM: RIGHT FEMUR PORTABLE 2 VIEW COMPARISON:  06/27/2018 FINDINGS: Acute fracture distal right femur has been fixed with a lateral plate and screws. Total knee replacement. Screws in the right femoral neck and intramedullary rod extend to the distal femur unchanged. Chronic periosteal new bone medial to the proximal right femur is unchanged degenerated related to a healed fracture. IMPRESSION: Lateral plate fixation of distal femoral fracture. Spiral fracture with posterior displacement of the femoral condyle similar to the prior study. Moderate fracture displacement similar to the prior study. Electronically Signed   By: Marlan Palau M.D.   On: 06/28/2018 11:57        Scheduled Meds: . amLODipine  2.5 mg Oral Daily  . aspirin  325 mg Oral Daily  . feeding supplement  237 mL Oral BID BM  . ferrous sulfate  325 mg Oral BID WC  . insulin aspart  0-9 Units Subcutaneous Q4H  . levothyroxine  125 mcg Oral QAC breakfast  . metoprolol tartrate  12.5 mg Oral BID  . polyethylene glycol  17 g Oral BID  . senna-docusate  1 tablet Oral BID  . simvastatin  10 mg Oral q1800   Continuous Infusions: . sodium chloride 75 mL/hr at 06/29/18 0500  . methocarbamol (ROBAXIN) IV       LOS: 2 days    Time spent: 35 minutes.     Alba Cory, MD [facility] Pager [phone]  If 7PM-7AM, please contact night-coverage [email or web address] Password [email or web address] 06/29/2018, 12:50 PM

## 2018-06-29 NOTE — Evaluation (Signed)
Occupational Therapy Evaluation Patient Details Name: [name] MRN: [id number] DOB: Feb 22, 1926 Today's Date: 06/29/2018    History of Present Illness [name] is a 84 y.o. female with medical history significant for coronary artery disease status post CABG, paroxysmal atrial flutter no longer on anticoagulation due to recurrent falls, hypothyroidism, hypertension, chronic kidney disease stage III, and diet-controlled diabetes, now presenting to the emergency department with right knee pain after a fall at home. Underwent ORIF for R distal femur fx on 06/28/18.   Clinical Impression   Pt lives with her daughter, is ambulatory and assisted for ADL and IADL due to cognitive impairment. Pt presents with R LE pain, generalized weakness and inability to stand or pivot without +2 max assist. She is able to participate in grooming and self feed with set up. Pt requires max to total assist for bathing, dressing and toileting. She will need post acute rehab in SNF, daughter is in agreement. Will follow acutely.    Follow Up Recommendations  SNF;Supervision/Assistance - 24 hour    Equipment Recommendations       Recommendations for Other Services       Precautions / Restrictions Precautions Precautions: Fall Restrictions Weight Bearing Restrictions: Yes RLE Weight Bearing: Weight bearing as tolerated      Mobility Bed Mobility Overal bed mobility: Needs Assistance Bed Mobility: Supine to Sit     Supine to sit: Max assist     General bed mobility comments: max A for LE's off bed and elevation of trunk to sitting, painful with mvmt  Transfers Overall transfer level: Needs assistance Equipment used: 2 person hand held assist Transfers: Sit to/from [employer] Sit to Stand: Max assist;+2 physical assistance Stand pivot transfers: Max assist;+2 physical assistance       General transfer comment: pt did place RLE on floor but would not take enough wt on  it to move LLE. Slid feet to turn to chair, needed facilitation at hips    Balance Overall balance assessment: Needs assistance;History of Falls Sitting-balance support: Single extremity supported Sitting balance-Leahy Scale: Fair Sitting balance - Comments: able to maintain static stance EOB   Standing balance support: Bilateral upper extremity supported Standing balance-Leahy Scale: Zero Standing balance comment: needed max A +2 to stand                           ADL either performed or assessed with clinical judgement   ADL Overall ADL's : Needs assistance/impaired Eating/Feeding: Set up;Sitting   Grooming: Wash/dry hands;Sitting;Set up   Upper Body Bathing: Maximal assistance;Sitting   Lower Body Bathing: Total assistance;Sit to/from stand   Upper Body Dressing : Maximal assistance;Sitting   Lower Body Dressing: Total assistance;+2 for physical assistance;Sit to/from stand   Toilet Transfer: +2 for physical assistance;Maximal assistance;Stand-pivot   Toileting- Architect and Hygiene: Total assistance;+2 for physical assistance;Sit to/from stand       Functional mobility during ADLs: Maximal assistance;+2 for physical assistance(pivot to chair) General ADL Comments: pt limited by confusion     Vision Patient Visual Report: No change from baseline       Perception     Praxis      Pertinent Vitals/Pain Pain Assessment: Faces Faces Pain Scale: Hurts even more Pain Location: RLE Pain Descriptors / Indicators: Operative site guarding;Sore Pain Intervention(s): Monitored during session;Repositioned;Ice applied     Hand Dominance Right   Extremity/Trunk Assessment Upper Extremity Assessment Upper Extremity Assessment: Overall WFL for  tasks assessed(arthritic changes in hands)   Lower Extremity Assessment Lower Extremity Assessment: Defer to PT evaluation RLE Deficits / Details: unable to lift RLE against gravity due to pain. Knee ext  2-/5 RLE: Unable to fully assess due to pain RLE Sensation: WNL RLE Coordination: decreased gross motor;decreased fine motor   Cervical / Trunk Assessment Cervical / Trunk Assessment: Normal   Communication Communication Communication: No difficulties;HOH   Cognition Arousal/Alertness: Awake/alert Behavior During Therapy: WFL for tasks assessed/performed Overall Cognitive Status: History of cognitive impairments - at baseline                                 General Comments: pt oriented only to self   General Comments  pt's daughter arrived after session and noted that pt's mental status is her baseline. She is agreeable to SNF for rehab and is hoping for [facility] because her mom's sister is already there.     Exercises Exercises: General Lower Extremity General Exercises - Lower Extremity Long Arc Quad: Right;5 reps;[facility]   Shoulder Instructions      Home Living Family/patient expects to be discharged to:: Skilled nursing facility Living Arrangements: Children                               Additional Comments: pt very confused, unable to give any history, lives with daughter per nurse      Prior Functioning/Environment Level of Independence: Needs assistance  Gait / Transfers Assistance Needed: was ambulatory AD ADL's / Homemaking Assistance Needed: needed assist due to cognition            OT Problem List: Decreased strength;Impaired balance (sitting and/or standing);Decreased cognition;Decreased safety awareness;Decreased knowledge of use of DME or AE;Pain      OT Treatment/Interventions: Self-care/ADL training;DME and/or AE instruction;Therapeutic activities;Patient/family education;Balance training    OT Goals(Current goals can be found in the care plan section) Acute Rehab OT Goals Patient Stated Goal: none stated OT Goal Formulation: With family Time For Goal Achievement: 07/13/18 Potential to Achieve Goals: Good ADL  Goals Pt Will Perform Grooming: with supervision;with set-up;sitting Pt Will Perform Upper Body Bathing: with min assist;sitting Pt Will Perform Upper Body Dressing: with min assist;sitting Pt Will Transfer to Toilet: with mod assist;stand pivot transfer;bedside commode Additional ADL Goal #1: Pt will perform bed mobility with moderate assistance in preparation for ADL.  OT Frequency: Min 2X/week   Barriers to D/C:            Co-evaluation PT/OT/SLP Co-Evaluation/Treatment: Yes Reason for Co-Treatment: Necessary to address cognition/behavior during functional activity;For patient/therapist safety PT goals addressed during session: Mobility/safety with mobility;Balance OT goals addressed during session: ADL's and self-care      AM-PAC PT "6 Clicks" Daily Activity     Outcome Measure Help from another person eating meals?: A Little Help from another person taking care of personal grooming?: A Little Help from another person toileting, which includes using toliet, bedpan, or urinal?: Total Help from another person bathing (including washing, rinsing, drying)?: A Lot Help from another person to put on and taking off regular upper body clothing?: A Lot Help from another person to put on and taking off regular lower body clothing?: Total 6 Click Score: 12   End of Session Nurse Communication: Mobility status  Activity Tolerance:   Patient left: in chair;with call bell/phone within reach;with chair alarm set;with nursing/sitter  in room;with family/visitor present  OT Visit Diagnosis: Unsteadiness on feet (R26.81);Muscle weakness (generalized) (M62.81);Pain;Other symptoms and signs involving cognitive function;History of falling (Z91.81)                Time: 1610-9604 OT Time Calculation (min): 34 min Charges:  OT General Charges $OT Visit: 1 Visit OT Evaluation $OT Eval Moderate Complexity: 1 Mod  Evern Bio 06/29/2018, 10:03 AM  06/29/2018 Martie Round, OTR/L Pager:  [phone]

## 2018-06-29 NOTE — NC FL2 (Signed)
[place] MEDICAID FL2 LEVEL OF CARE SCREENING TOOL     IDENTIFICATION  Patient Name: [name] Birthdate: [birth year] Sex: female Admission Date (Current Location): 06/26/2018  [facility] and [place] Number:  Producer, television/film/video and Address:  [facility] [place], [address], [place], [place] [address]      Provider Number: [id number]  Attending Physician Name and Address:  Alba Cory, MD  Relative Name and Phone Number:  Jolyne Loa - daughter; [phone]    Current Level of Care: Hospital Recommended Level of Care: Skilled Nursing Facility Prior Approval Number:    Date Approved/Denied:   PASRR Number: [id number](Eff. 06/29/18)  Discharge Plan: SNF    Current Diagnoses: Patient Active Problem List   Diagnosis Date Noted  . Closed fracture of distal end of right femur (HCC) 06/27/2018  . CKD (chronic kidney disease), stage III (HCC) 06/27/2018  . Prolonged QT interval 06/27/2018  . Closed fracture of right femur, initial encounter (HCC) 06/27/2018  . Paroxysmal atrial flutter 05/13/2017  . Diabetes mellitus, type 2 (HCC) 02/15/2014  . Difficulty hearing 02/14/2014  . Problems influencing health status 02/14/2014  . BMI (body mass index), pediatric, 5% to less than 85% for age 01/14/2014  . Coronary artery disease involving coronary bypass graft of native heart with angina pectoris (HCC) 09/21/2013    Class: Chronic  . Essential hypertension, benign 09/21/2013  . Hyperlipidemia 09/21/2013  . Bradycardia 09/21/2013    Class: Chronic  . Adult hypothyroidism 08/17/2013  . Routine general medical examination at a health care facility 08/28/2012    Orientation RESPIRATION BLADDER Height & Weight     Self  Normal External catheter Weight: 129 lb (58.5 kg) Height:  5\' 2"  (157.5 cm)  BEHAVIORAL SYMPTOMS/MOOD NEUROLOGICAL BOWEL NUTRITION STATUS      Continent Diet(Regular)  AMBULATORY STATUS COMMUNICATION OF NEEDS Skin   Total  Care(Patient was unable to ambulate with PT during evaluation ) Verbally Other (Comment)(Incision right leg)                       Personal Care Assistance Level of Assistance  Bathing, Feeding, Dressing Bathing Assistance: Maximum assistance Feeding assistance: Limited assistance(Assistance with set-up) Dressing Assistance: Maximum assistance     Functional Limitations Info  Sight, Hearing, Speech Sight Info: Adequate Hearing Info: Adequate Speech Info: Adequate    SPECIAL CARE FACTORS FREQUENCY  PT (By licensed PT), OT (By licensed OT)     PT Frequency: Evaluated 9/2 and a minimum of 3X per week therapy recommended during acute inpatient stay OT Frequency: Evaluated 9/2 and a minimum of 2X per week therapy recommended during acute inpatient stay            Contractures Contractures Info: Not present    Additional Factors Info  Code Status, Allergies, Insulin Sliding Scale Code Status Info: Full Allergies Info: Ace Inhibitors   Insulin Sliding Scale Info: 0-9 Units every 4 hours       Current Medications (06/29/2018):  This is the current hospital active medication list Current Facility-Administered Medications  Medication Dose Route Frequency Provider Last Rate Last Dose  . 0.9 %  sodium chloride infusion   Intravenous Continuous Haddix, Gillie Manners, MD 75 mL/hr at 06/29/18 0500    . acetaminophen (TYLENOL) tablet 650 mg  650 mg Oral Q6H PRN Macon Large, NP   650 mg at 06/29/18 0907  . aspirin tablet 325 mg  325 mg Oral Daily Haddix, Gillie Manners,  MD   325 mg at 06/29/18 0908  . feeding supplement (ENSURE SURGERY) liquid 237 mL  237 mL Oral BID BM Haddix, Gillie Manners, MD   237 mL at 06/29/18 1339  . ferrous sulfate tablet 325 mg  325 mg Oral BID WC Regalado, [name] A, MD      . hydrALAZINE (APRESOLINE) injection 10 mg  10 mg Intravenous Q8H PRN Haddix, Gillie Manners, MD      . HYDROcodone-acetaminophen (NORCO/VICODIN) 5-325 MG per tablet 1 tablet  1 tablet Oral Q6H PRN  Haddix, Gillie Manners, MD      . insulin aspart (novoLOG) injection 0-9 Units  0-9 Units Subcutaneous Q4H Haddix, Gillie Manners, MD   2 Units at 06/29/18 1155  . levothyroxine (SYNTHROID, LEVOTHROID) tablet 125 mcg  125 mcg Oral QAC breakfast Regalado, [name] A, MD   125 mcg at 06/29/18 0908  . menthol-cetylpyridinium (CEPACOL) lozenge 3 mg  1 lozenge Oral PRN Regalado, [name] A, MD      . methocarbamol (ROBAXIN) tablet 500 mg  500 mg Oral Q6H PRN Haddix, Gillie Manners, MD   500 mg at 06/29/18 0908   Or  . methocarbamol (ROBAXIN) 500 mg in dextrose 5 % 50 mL IVPB  500 mg Intravenous Q6H PRN Haddix, Gillie Manners, MD      . metoprolol tartrate (LOPRESSOR) tablet 12.5 mg  12.5 mg Oral BID Regalado, [name] A, MD   12.5 mg at 06/29/18 0908  . morphine 2 MG/ML injection 0.5 mg  0.5 mg Intravenous Q2H PRN Haddix, Gillie Manners, MD      . polyethylene glycol (MIRALAX / GLYCOLAX) packet 17 g  17 g Oral BID Regalado, [name] A, MD   17 g at 06/29/18 1017  . senna-docusate (Senokot-S) tablet 1 tablet  1 tablet Oral BID Regalado, [name] A, MD   1 tablet at 06/29/18 1017  . simvastatin (ZOCOR) tablet 10 mg  10 mg Oral q1800 Haddix, Gillie Manners, MD   10 mg at 06/28/18 1710     Discharge Medications: Please see discharge summary for a list of discharge medications.  Relevant Imaging Results:  Relevant Lab Results:   Additional Information ss#[id number]  Cristobal Goldmann, LCSW

## 2018-06-30 ENCOUNTER — Encounter (HOSPITAL_COMMUNITY): Payer: Self-pay | Admitting: Student

## 2018-06-30 LAB — BASIC METABOLIC PANEL
Anion gap: 9 (ref 5–15)
BUN: 24 mg/dL — ABNORMAL HIGH (ref 8–23)
CO2: 25 mmol/L (ref 22–32)
Calcium: 8.6 mg/dL — ABNORMAL LOW (ref 8.9–10.3)
Chloride: 109 mmol/L (ref 98–111)
Creatinine, Ser: 0.96 mg/dL (ref 0.44–1.00)
GFR calc Af Amer: 59 mL/min — ABNORMAL LOW (ref >60)
GFR calc non Af Amer: 51 mL/min — ABNORMAL LOW (ref >60)
Glucose, Bld: 142 mg/dL — ABNORMAL HIGH (ref 70–99)
Potassium: 3.6 mmol/L (ref 3.5–5.1)
Sodium: 143 mmol/L (ref 135–145)

## 2018-06-30 LAB — CBC
HCT: 26.5 % — ABNORMAL LOW (ref 36.0–46.0)
Hemoglobin: 8.4 g/dL — ABNORMAL LOW (ref 12.0–15.0)
MCH: 29.7 pg (ref 26.0–34.0)
MCHC: 31.7 g/dL (ref 30.0–36.0)
MCV: 93.6 fL (ref 78.0–100.0)
Platelets: 157 10*3/uL (ref 150–400)
RBC: 2.83 MIL/uL — ABNORMAL LOW (ref 3.87–5.11)
RDW: 14.1 % (ref 11.5–15.5)
WBC: 11.3 10*3/uL — ABNORMAL HIGH (ref 4.0–10.5)

## 2018-06-30 LAB — GLUCOSE, CAPILLARY
Glucose-Capillary: 115 mg/dL — ABNORMAL HIGH (ref 70–99)
Glucose-Capillary: 123 mg/dL — ABNORMAL HIGH (ref 70–99)
Glucose-Capillary: 142 mg/dL — ABNORMAL HIGH (ref 70–99)
Glucose-Capillary: 148 mg/dL — ABNORMAL HIGH (ref 70–99)
Glucose-Capillary: 69 mg/dL — ABNORMAL LOW (ref 70–99)

## 2018-06-30 MED ORDER — ENSURE SURGERY PO LIQD: 237.0000 mL | Freq: Two times a day (BID) | ORAL | 0 refills | Status: AC

## 2018-06-30 MED ORDER — LEVOTHYROXINE SODIUM 125 MCG PO TABS: 125.0000 ug | ORAL_TABLET | Freq: Every day | ORAL | 0 refills | Status: AC

## 2018-06-30 MED ORDER — POLYETHYLENE GLYCOL 3350 17 G PO PACK: 17.0000 g | PACK | Freq: Two times a day (BID) | ORAL | 0 refills | Status: DC

## 2018-06-30 MED ORDER — FERROUS SULFATE 325 (65 FE) MG PO TABS: 325.0000 mg | ORAL_TABLET | Freq: Two times a day (BID) | ORAL | 3 refills | Status: DC

## 2018-06-30 MED ORDER — METOPROLOL TARTRATE 25 MG PO TABS: 12.5000 mg | ORAL_TABLET | Freq: Two times a day (BID) | ORAL | 0 refills | Status: AC

## 2018-06-30 MED ORDER — AMLODIPINE BESYLATE 2.5 MG PO TABS
2.5000 mg | ORAL_TABLET | Freq: Every day | ORAL | Status: DC
  Administered 2018-07-01: 2.5 mg via ORAL
  Filled 2018-06-30: qty 1

## 2018-06-30 MED ORDER — SENNOSIDES-DOCUSATE SODIUM 8.6-50 MG PO TABS: 1.0000 | ORAL_TABLET | Freq: Two times a day (BID) | ORAL | 0 refills | Status: DC

## 2018-06-30 MED ORDER — HYDROCODONE-ACETAMINOPHEN 5-325 MG PO TABS: 1.0000 | ORAL_TABLET | Freq: Four times a day (QID) | ORAL | 0 refills | Status: AC | PRN

## 2018-06-30 MED ORDER — ASPIRIN 325 MG PO TABS: 325.0000 mg | ORAL_TABLET | Freq: Every day | ORAL | 0 refills | Status: AC

## 2018-06-30 MED ORDER — ACETAMINOPHEN 325 MG PO TABS: 650.0000 mg | ORAL_TABLET | Freq: Four times a day (QID) | ORAL | 0 refills | Status: AC | PRN

## 2018-06-30 NOTE — Care Management Important Message (Signed)
Important Message  Patient Details  Name: [name] MRN: [id number] Date of Birth: 08/11/1926   Medicare Important Message Given:  Yes    Iridiana Fonner 06/30/2018, 4:10 PM

## 2018-06-30 NOTE — Clinical Social Work Note (Signed)
Clinical Social Work Assessment  Patient Details  Name: [name] MRN: [id number] Date of Birth: [birth year]  Date of referral:   06/29/18               Reason for consult:   Discharge Planning, Skilled nursing facility placement                Permission sought to share information with:   Family supports Permission granted to share information::   No, as patient oriented to self only.   Name::        Agency::     Relationship::     Contact Information:     Housing/Transportation Living arrangements for the past 2 months:   Home Source of Information:   Daughter [name] Patient Interpreter Needed:   No Criminal Activity/Legal Involvement Pertinent to Current Situation/Hospitalization:   None Significant Relationships:   Daughter, [name] - [phone].   Lives with:   Patient's daughter lives with her. Daughter requires total care. Do you feel safe going back to the place where you live?   No, daughter [name] in agreement with ST rehab for patient. Need for family participation in patient care:   Yes.  Care giving concerns:  CSW talked with patient's daughter [name] [phone]) regarding discharge planning for patient, as Ms. Delford Field oriented to self only.  Social Worker assessment / plan: CSW talked with daughter by phone regarding discharge plan for patient. Daughter advised CSW that [facility] is the preferred facility as her older sister is there for rehab due to a fall and hip fracture. Ms. Mayford Knife reported that her prior to hospitalization, her mom lived at home and cared for her daughter (ms. Lea's sister) who has requires total care from infancy. Per Ms. Mayford Knife, her brother lives behind her mom and helps our with yard work and is looking after their sister since their mom is in the hospital. Daughter provided history of what brought patient to hospital: she had a change in mentation and was taken to her doctor as Ms. Mayford Knife thought she had a UTI.  Patient was then brought to hospital and admitted.   Daughter in agreement with short-term rehab and went to [facility] today to talk with the admissions director regarding her mother. CSW explained facility search process and informed her that patient's information will be sent out to [facility] and all the other facilities in [facility] and she expressed understanding and agreement.  Employment status:  Retired Theme park manager) PT Recommendations:  Skilled Nursing Facility Information / Referral to community resources:  Other (Comment Required)(Talked with daughter regarding need for SNF)  Patient/Family's Response to care:  No concerns expressed by daughter regarding patient's care during hospitalization.  Patient/Family's Understanding of and Emotional Response to Diagnosis, Current Treatment, and Prognosis:  Daughter expressed understanding and agreement regarding her mother's need for ST rehab.  Emotional Assessment Appearance:   Unable to assess. Did not visit with patient. Talked with daughter by phone. Attitude/Demeanor/Rapport:   Unable to assess Affect (typically observed):   Unable to assessd Orientation:   Oriented to self only Alcohol / Substance use:   Patient has never smoked and does not drink or use illicit drugs. Psych involvement (Current and /or in the community):   No  Discharge Needs  Concerns to be addressed:   Discharge planning concerns Readmission within the last 30 days:   Yes Current discharge risk:   None Barriers to Discharge:  Continued medical Work up, English as a second language teacher.   Cristobal Goldmann, LCSW 06/30/2018, 12:39 PM

## 2018-06-30 NOTE — Progress Notes (Signed)
Physical Therapy Treatment Patient Details Name: [name] MRN: [id number] DOB: 1926-02-28 Today's Date: 06/30/2018    History of Present Illness [name] is a 84 y.o. female with medical history significant for coronary artery disease status post CABG, paroxysmal atrial flutter no longer on anticoagulation due to recurrent falls, hypothyroidism, hypertension, chronic kidney disease stage III, and diet-controlled diabetes, now presenting to the emergency department with right knee pain after a fall at home. Underwent ORIF for R distal femur fx on 06/28/18.    PT Comments    Pt performed bed mobility and multiple sit to stand transfers from varrying surface heights.  Pt is easily agitated and presents with difficulty following commands to progress mobility safely.  She performed standing with sara stedy frame and required decreased assistance.  Plan next session for continued functional mobility and progression to gt training as tolerated.  Continue to recommend SNF placement at this time to improve strength and function.  Cognition is a large limiting factor.      Follow Up Recommendations  SNF;Supervision/Assistance - 24 hour     Equipment Recommendations  None recommended by PT    Recommendations for Other Services       Precautions / Restrictions Precautions Precautions: Fall Restrictions Weight Bearing Restrictions: Yes RLE Weight Bearing: Weight bearing as tolerated    Mobility  Bed Mobility Overal bed mobility: Needs Assistance Bed Mobility: Supine to Sit     Supine to sit: Mod assist     General bed mobility comments: Asssistance to scoot LEs and bottom to edge of bed.  Cues for trunk elevation.  HPB elevated with heavy reliance on railings.    Transfers Overall transfer level: Needs assistance Equipment used: (sara stedy) Transfers: Sit to/from Stand Sit to Stand: Mod assist;+2 physical assistance(From bed.  Once in frame patient performing sit to  stand into partial standing with min to min guard assistance.  )         General transfer comment: Pt able to bear weight through R LE but unaware of why her leg was hurting.  Pt required cues for hand placement on stedy cross bar and pushing through LE and pulling with UEs to achieve standing.  Pt performed STS from bed x3, from commode x1, and from stedy plates x4.  Ultimately transferred into recliner chair with assistance and placed at nursing station to eat her supper.    Ambulation/Gait             General Gait Details: unable today, too agitated to follow commands for progression of gait training.    Stairs             Wheelchair Mobility    Modified Rankin (Stroke Patients Only)       Balance     Sitting balance-Leahy Scale: Fair       Standing balance-Leahy Scale: Poor                              Cognition Arousal/Alertness: Awake/alert Behavior During Therapy: Agitated;Anxious Overall Cognitive Status: History of cognitive impairments - at baseline                                 General Comments: Pt remains only oriented to her self.  Speech is tangential and she became agitated during session.  Require nurse to present in room to calm patient.  Exercises      General Comments        Pertinent Vitals/Pain Pain Assessment: Faces Faces Pain Scale: Hurts even more Pain Location: RLE Pain Descriptors / Indicators: Operative site guarding;Sore Pain Intervention(s): Monitored during session;Repositioned    Home Living                      Prior Function            PT Goals (current goals can now be found in the care plan section) Acute Rehab PT Goals Patient Stated Goal: none stated Potential to Achieve Goals: Good Progress towards PT goals: Progressing toward goals    Frequency    Min 3X/week      PT Plan Current plan remains appropriate    Co-evaluation              AM-PAC  PT "6 Clicks" Daily Activity  Outcome Measure  Difficulty turning over in bed (including adjusting bedclothes, sheets and blankets)?: Unable Difficulty moving from lying on back to sitting on the side of the bed? : Unable Difficulty sitting down on and standing up from a chair with arms (e.g., wheelchair, bedside commode, etc,.)?: Unable Help needed moving to and from a bed to chair (including a wheelchair)?: A Lot Help needed walking in hospital room?: Total Help needed climbing 3-5 steps with a railing? : Total 6 Click Score: 7    End of Session Equipment Utilized During Treatment: Gait belt Activity Tolerance: Patient tolerated treatment well Patient left: in chair;with call bell/phone within reach;with chair alarm set;with family/visitor present Nurse Communication: Mobility status PT Visit Diagnosis: Unsteadiness on feet (R26.81);Repeated falls (R29.6);Pain;Difficulty in walking, not elsewhere classified (R26.2) Pain - Right/Left: Right Pain - part of body: Knee     Time: 8280-0349 PT Time Calculation (min) (ACUTE ONLY): 36 min  Charges:  $Therapeutic Activity: 23-37 mins                     Joycelyn Rua, PTA pager [phone]    [name] 06/30/2018, 1:33 PM

## 2018-06-30 NOTE — Discharge Summary (Signed)
Physician Discharge Summary  [name]:[id number] DOB: 05-17-1926 DOA: 06/26/2018  PCP: Henrine Screws, MD  Admit date: 06/26/2018 Discharge date: 06/30/2018  Admitted From: Home  Disposition:  SNF  Recommendations for Outpatient Follow-up:  1. Follow up with PCP in 1-2 weeks 2. Please obtain BMP/CBC in one week 3. Follow up with ortho in 2 weeks.    Discharge Condition: Stable.  CODE STATUS: Full code.  Diet recommendation: Heart Healthy  Brief/Interim Summary: Brief Narrative:Brittney M Elliottis a 84 y.o.femalewith medical history significant forcoronary artery disease status post CABG, paroxysmal atrial flutter no longer on anticoagulation due to recurrent falls, hypothyroidism, hypertension, chronic kidney disease stage III, and diet-controlled diabetes, now presenting to the emergency department with right knee pain after a fall at home. Patient had reportedly been in her usual state of health with no complaints or recent illness when she fell at home last night. She denies hitting her head or losing consciousness, but experienced immediate and severe pain at the right knee. EMS was called and she was treated with fentanyl prior to arrival in the ED.  ED Course:Upon arrival to the ED, patient is found to be afebrile, saturating well on room air, hypertensive, and with vitals otherwise normal. EKG features a sinus rhythm with QTc interval of 580 ms. Chest x-ray is notable for cardiomegaly, but no acute cardiopulmonary disease. Noncontrast head CT is negative for acute intracranial abnormality and cervical spine CT is negative for acute pathology. Radiographs of the pelvis are negative for acute fracture and right femur films reveal mildly displaced fracture involving the mid to distal right femur. Orthopedic surgery was consulted and recommended medical admission with the patient to be kept n.p.o.   Assessment & Plan:   Principal Problem:   Closed fracture of  distal end of right femur (HCC) Active Problems:   Coronary artery disease involving coronary bypass graft of native heart with angina pectoris (HCC)   Essential hypertension, benign   Adult hypothyroidism   Diabetes mellitus, type 2 (HCC)   Paroxysmal atrial flutter   CKD (chronic kidney disease), stage III (HCC)   Prolonged QT interval   Closed fracture of right femur, initial encounter (HCC)  1-Right femur fracture;  Open reduction and fixation of right distal femur. Removal of hardware of right femur.  Post sx 9-01.  Pain management.  DVT per ortho; now on aspirin 325.  Started  bowel regimen. Miralax.  PT recommending SNF Stable for discharge.  Weightbearing: WBAT RLE  Insicional and dressing care: Dressing change POD3  A fib;  not on anticoagulation.  Continue with metoprolol.   Acute blood loss anemia.  Post sx hb decrease from 12 to 8.  Repeat levels in am.  hb has been stable. Discharge on iron supplement.   Prolong Qt  Repeat EKG improved QT   Hypothyroidism; continue with synthroid. Increase dose TSH at 26   DM; SSI.   CKD stage III;  Monitor Monitor renal function.  Stable.   Delirium;  In setting of acute illness.  Stable.   HTN; Resume norvasc and HCTZ  Episode of bradycardia; while asleep.  holder parameter for metoprolol.   Discharge Diagnoses:  Principal Problem:   Closed fracture of distal end of right femur [facility]) Active Problems:   Coronary artery disease involving coronary bypass graft of native heart with angina pectoris (HCC)   Essential hypertension, benign   Adult hypothyroidism   Diabetes mellitus, type 2 (HCC)   Paroxysmal atrial flutter   CKD (chronic  kidney disease), stage III (HCC)   Prolonged QT interval   Closed fracture of right femur, initial encounter [facility])    Discharge Instructions  Discharge Instructions    Diet - low sodium heart healthy   Complete by:  As directed    Increase activity slowly    Complete by:  As directed      Allergies as of 06/30/2018      Reactions   Ace Inhibitors    Other reaction(s): Other (See Comments) cough      Medication List    STOP taking these medications   aspirin EC 81 MG tablet Replaced by:  aspirin 325 MG tablet     TAKE these medications   acetaminophen 325 MG tablet Commonly known as:  TYLENOL Take 2 tablets (650 mg total) by mouth every 6 (six) hours as needed for mild pain or headache.   amLODipine 2.5 MG tablet Commonly known as:  NORVASC Take 2.5 mg by mouth daily.   aspirin 325 MG tablet Take 1 tablet (325 mg total) by mouth daily. Replaces:  aspirin EC 81 MG tablet   calcium carbonate 1250 MG capsule Take 1,250 mg by mouth daily.   CENTRUM SILVER PO Take 1 tablet by mouth daily.   CINNAMON PO Take 1 tablet by mouth daily.   feeding supplement Liqd Take 237 mLs by mouth 2 (two) times daily between meals.   ferrous sulfate 325 (65 FE) MG tablet Take 1 tablet (325 mg total) by mouth 2 (two) times daily with a meal.   folic acid 400 MCG tablet Commonly known as:  FOLVITE Take 400 mcg by mouth daily.   hydrochlorothiazide 25 MG tablet Commonly known as:  HYDRODIURIL Take 25 mg by mouth daily.   HYDROcodone-acetaminophen 5-325 MG tablet Commonly known as:  NORCO/VICODIN Take 1 tablet by mouth every 6 (six) hours as needed for moderate pain.   levothyroxine 125 MCG tablet Commonly known as:  SYNTHROID, LEVOTHROID Take 1 tablet (125 mcg total) by mouth daily before breakfast. What changed:    medication strength  how much to take   MAGNESIUM PO Take 1 tablet by mouth daily.   metoprolol tartrate 25 MG tablet Commonly known as:  LOPRESSOR Take 0.5 tablets (12.5 mg total) by mouth 2 (two) times daily.   Omega-3 1000 MG Caps Take 1,000 mg by mouth daily.   polyethylene glycol packet Commonly known as:  MIRALAX / GLYCOLAX Take 17 g by mouth 2 (two) times daily.   pyridOXINE 100 MG tablet Commonly  known as:  VITAMIN B-6 Take 100 mg by mouth daily.   senna-docusate 8.6-50 MG tablet Commonly known as:  Senokot-S Take 1 tablet by mouth 2 (two) times daily.   simvastatin 10 MG tablet Commonly known as:  ZOCOR Take 10 mg by mouth daily.   VITAMIN B 12 PO Take 1,000 mg by mouth daily.       Allergies  Allergen Reactions  . Ace Inhibitors     Other reaction(s): Other (See Comments) cough    Consultations: Ortho   Procedures/Studies: Dg Chest 1 View  Result Date: 06/27/2018 CLINICAL DATA:  Patient status post fall. EXAM: CHEST  1 VIEW COMPARISON:  Chest radiograph 06/23/2011 FINDINGS: Stable enlarged cardiac and mediastinal contours status post median sternotomy. No consolidative pulmonary opacities. No pleural effusion or pneumothorax. Thoracic spine degenerative changes. IMPRESSION: No acute cardiopulmonary process.  Cardiomegaly. Electronically Signed   By: Annia Belt M.D.   On: 06/27/2018 01:04   Ct Head  Wo Contrast  Result Date: 06/27/2018 CLINICAL DATA:  84 year old female with C-spine trauma. EXAM: CT HEAD WITHOUT CONTRAST CT CERVICAL SPINE WITHOUT CONTRAST TECHNIQUE: Multidetector CT imaging of the head and cervical spine was performed following the standard protocol without intravenous contrast. Multiplanar CT image reconstructions of the cervical spine were also generated. COMPARISON:  None. FINDINGS: CT HEAD FINDINGS Brain: There is mild age-related atrophy and chronic microvascular ischemic changes. There is no acute intracranial hemorrhage. No mass effect or midline shift. No extra-axial fluid collection. Vascular: No hyperdense vessel or unexpected calcification. Skull: Normal. Negative for fracture or focal lesion. Sinuses/Orbits: There is opacification of the right sphenoid sinus. The remainder of the visualized paranasal sinuses and mastoid air cells are clear. Other: None CT CERVICAL SPINE FINDINGS Alignment: No acute subluxation. There is grade 1 C4-C5  anterolisthesis. Skull base and vertebrae: No acute fracture. The bones are osteopenic. Soft tissues and spinal canal: No prevertebral fluid or swelling. No visible canal hematoma. Disc levels:  Degenerative changes primarily at C5-C6 and C6-C7. Upper chest: Negative. Other: Partially visualized median sternotomy wires. Bilateral carotid bulb calcified plaques. IMPRESSION: 1. No acute intracranial hemorrhage. 2. No acute/traumatic cervical pathology. Electronically Signed   By: Elgie Collard M.D.   On: 06/27/2018 01:24   Ct Cervical Spine Wo Contrast  Result Date: 06/27/2018 CLINICAL DATA:  84 year old female with C-spine trauma. EXAM: CT HEAD WITHOUT CONTRAST CT CERVICAL SPINE WITHOUT CONTRAST TECHNIQUE: Multidetector CT imaging of the head and cervical spine was performed following the standard protocol without intravenous contrast. Multiplanar CT image reconstructions of the cervical spine were also generated. COMPARISON:  None. FINDINGS: CT HEAD FINDINGS Brain: There is mild age-related atrophy and chronic microvascular ischemic changes. There is no acute intracranial hemorrhage. No mass effect or midline shift. No extra-axial fluid collection. Vascular: No hyperdense vessel or unexpected calcification. Skull: Normal. Negative for fracture or focal lesion. Sinuses/Orbits: There is opacification of the right sphenoid sinus. The remainder of the visualized paranasal sinuses and mastoid air cells are clear. Other: None CT CERVICAL SPINE FINDINGS Alignment: No acute subluxation. There is grade 1 C4-C5 anterolisthesis. Skull base and vertebrae: No acute fracture. The bones are osteopenic. Soft tissues and spinal canal: No prevertebral fluid or swelling. No visible canal hematoma. Disc levels:  Degenerative changes primarily at C5-C6 and C6-C7. Upper chest: Negative. Other: Partially visualized median sternotomy wires. Bilateral carotid bulb calcified plaques. IMPRESSION: 1. No acute intracranial hemorrhage. 2.  No acute/traumatic cervical pathology. Electronically Signed   By: Elgie Collard M.D.   On: 06/27/2018 01:24   Ct Knee Right Wo Contrast  Result Date: 06/27/2018 CLINICAL DATA:  84 year old female with trauma to the right lower extremity. EXAM: CT OF THE right KNEE WITHOUT CONTRAST TECHNIQUE: Multidetector CT imaging of the right knee was performed according to the standard protocol. Multiplanar CT image reconstructions were also generated. COMPARISON:  Earlier radiograph dated 06/27/2018 FINDINGS: Bones/Joint/Cartilage There is a displaced and comminuted spiral fracture of the mid to distal femoral diaphysis extending to the distal femoral metaphysis and the superior portion of the femoral component of the knee arthroplasty. There is a partially visualized right femoral intramedullary rod. The fracture extends along the visualized portion of the intramedullary rod. Comminuted and displaced fractures of the distal femoral metadiaphysis. The knee arthroplasty appears in anatomic alignment with limited evaluation due to streak artifact. There is no dislocation. There is a small to moderate suprapatellar effusion or hematoma. Ligaments Suboptimally assessed by CT. Muscles and Tendons Probable edema or intramuscular  hematoma involving the visualized portion of the distal quadriceps musculature. Soft tissues Diffuse stranding of the subcutaneous fat as well as stranding of the fat in the posterior knee compartment. IMPRESSION: Displaced comminuted fracture of the mid to distal right femur as described. Electronically Signed   By: Elgie Collard M.D.   On: 06/27/2018 04:59   Dg Knee Complete 4 Views Right  Result Date: 06/27/2018 CLINICAL DATA:  84 year old female with fall and trauma to the right knee. EXAM: RIGHT KNEE - COMPLETE 4+ VIEW COMPARISON:  Right knee radiograph dated 03/26/2005 FINDINGS: Partially visualized femoral intramedullary fixation rod and distal screws. There is a comminuted spiral  fracture of the mid to distal femur with approximately 7 mm distraction gap and medial displacement of the distal fracture fragment. The fracture extends to the distal intramedullary fixation rod and also extends to the distal femoral metadiaphysis above the femoral component of the knee arthroplasty. There is a total knee arthroplasty which appears intact and in anatomic alignment. The bones are osteopenic. Multiple surgical clips noted in the soft tissues of the medial aspect of the knee. Vascular calcification also noted. Small suprapatellar effusion. IMPRESSION: 1. Comminuted, mildly displaced spiral fracture of the mid to distal femur extending to the distal femoral metadiaphysis above the femoral component of the knee arthroplasty. 2. Partially visualized right femoral intramedullary fixation rod as well as total right knee arthroplasty. Electronically Signed   By: Elgie Collard M.D.   On: 06/27/2018 01:07   Dg C-arm 1-60 Min  Result Date: 06/28/2018 CLINICAL DATA:  ORIF of a distal RIGHT femur fracture. EXAM: Operative RIGHT FEMUR 2 VIEWS COMPARISON:  06/27/2018, 06/23/2011. FINDINGS: Eight spot images from the C-arm fluoroscopic device, AP and LATERAL views of the RIGHT femur obtained during the ORIF procedure are submitted for interpretation post-operatively. ORIF of the comminuted distal RIGHT femoral metadiaphyseal fracture with placement of a new plate laterally. This is in addition to the previously placed intramedullary nail at the time of in intertrochanteric femoral neck fracture in 2012. Alignment appears near anatomic on the operative images. Prior RIGHT knee arthroplasty without complicating features. The radiologic technologist documented 101 seconds of fluoroscopy time. IMPRESSION: ORIF of the comminuted distal RIGHT femoral metadiaphyseal fracture with placement of a new plate laterally. Electronically Signed   By: Hulan Saas M.D.   On: 06/28/2018 11:18   Dg Hip Unilat W Or Wo  Pelvis 2-3 Views Right  Result Date: 06/27/2018 CLINICAL DATA:  Larey Seat in kitchen.  RIGHT knee injury. EXAM: DG HIP (WITH OR WITHOUT PELVIS) 2-3V RIGHT COMPARISON:  None. FINDINGS: RIGHT femoral neck pinning with intramedullary rod, partially imaged distal femur fracture seen on single view. Extensive heterotopic calcific calcification proximal femur with cerclage wire. Osteopenia. No destructive bony lesions. No dislocation. Moderate vascular calcifications. Phleboliths project in the pelvis. IMPRESSION: Partially imaged acute distal femur fracture, recommend dedicated RIGHT femur radiograph. Electronically Signed   By: Awilda Metro M.D.   On: 06/27/2018 01:02   Dg Femur, Min 2 Views Right  Result Date: 06/28/2018 CLINICAL DATA:  ORIF of a distal RIGHT femur fracture. EXAM: Operative RIGHT FEMUR 2 VIEWS COMPARISON:  06/27/2018, 06/23/2011. FINDINGS: Eight spot images from the C-arm fluoroscopic device, AP and LATERAL views of the RIGHT femur obtained during the ORIF procedure are submitted for interpretation post-operatively. ORIF of the comminuted distal RIGHT femoral metadiaphyseal fracture with placement of a new plate laterally. This is in addition to the previously placed intramedullary nail at the time of in intertrochanteric femoral  neck fracture in 2012. Alignment appears near anatomic on the operative images. Prior RIGHT knee arthroplasty without complicating features. The radiologic technologist documented 101 seconds of fluoroscopy time. IMPRESSION: ORIF of the comminuted distal RIGHT femoral metadiaphyseal fracture with placement of a new plate laterally. Electronically Signed   By: Hulan Saas M.D.   On: 06/28/2018 11:18   Dg Femur Port, Min 2 Views Right  Result Date: 06/28/2018 CLINICAL DATA:  Fracture fixation EXAM: RIGHT FEMUR PORTABLE 2 VIEW COMPARISON:  06/27/2018 FINDINGS: Acute fracture distal right femur has been fixed with a lateral plate and screws. Total knee replacement.  Screws in the right femoral neck and intramedullary rod extend to the distal femur unchanged. Chronic periosteal new bone medial to the proximal right femur is unchanged degenerated related to a healed fracture. IMPRESSION: Lateral plate fixation of distal femoral fracture. Spiral fracture with posterior displacement of the femoral condyle similar to the prior study. Moderate fracture displacement similar to the prior study. Electronically Signed   By: Marlan Palau M.D.   On: 06/28/2018 11:57      Subjective: Alert, pleasantly confuse,.  Eating well.    Discharge Exam: Vitals:   06/29/18 2040 06/30/18 0518  BP: (!) 141/69 (!) 150/63  Pulse: 83 70  Resp:    Temp: 98.4 F (36.9 C) 97.8 F (36.6 C)  SpO2: 99% 100%   Vitals:   06/29/18 1110 06/29/18 1406 06/29/18 2040 06/30/18 0518  BP: 103/61 116/65 (!) 141/69 (!) 150/63  Pulse: (!) 59 78 83 70  Resp:  16    Temp:  97.9 F (36.6 C) 98.4 F (36.9 C) 97.8 F (36.6 C)  TempSrc:  Oral Oral Axillary  SpO2:  99% 99% 100%  Weight:      Height:        General: Pt is alert, awake, not in acute distress Cardiovascular: RRR, S1/S2 +, no rubs, no gallops Respiratory: CTA bilaterally, no wheezing, no rhonchi Abdominal: Soft, NT, ND, bowel sounds + Extremities: no edema, no cyanosis    The results of significant diagnostics from this hospitalization (including imaging, microbiology, ancillary and laboratory) are listed below for reference.     Microbiology: Recent Results (from the past 240 hour(s))  MRSA PCR Screening     Status: None   Collection Time: 06/28/18  5:13 AM  Result Value Ref Range Status   MRSA by PCR NEGATIVE NEGATIVE Final    Comment:        The GeneXpert MRSA Assay (FDA approved for NASAL specimens only), is one component of a comprehensive MRSA colonization surveillance program. It is not intended to diagnose MRSA infection nor to guide or monitor treatment for MRSA infections. Performed at [facility], [address], [place], [place] [address]      Labs: BNP (last 3 results) No results for input(s): BNP in the last 8760 hours. Basic Metabolic Panel: Recent Labs  Lab 06/27/18 0235 06/28/18 1258 06/29/18 0356 06/30/18 0956  NA 142 141 140 143  K 3.9 4.2 3.9 3.6  CL 105 105 106 109  CO2 25 27 25 25   GLUCOSE 191* 164* 181* 142*  BUN 34* 20 21 24*  CREATININE 1.16* 0.96 1.09* 0.96  CALCIUM 9.2 8.8* 8.3* 8.6*  MG  --  2.0 1.9  --    Liver Function Tests: No results for input(s): AST, ALT, ALKPHOS, BILITOT, PROT, ALBUMIN in the last 168 hours. No results for input(s): LIPASE, AMYLASE in the last 168 hours. No results  for input(s): AMMONIA in the last 168 hours. CBC: Recent Labs  Lab 06/27/18 0235 06/29/18 0356 06/30/18 0956  WBC 13.0* 11.4* 11.3*  NEUTROABS 10.5*  --   --   HGB 12.5 8.4* 8.4*  HCT 38.8 26.3* 26.5*  MCV 93.3 93.6 93.6  PLT 193 145* 157   Cardiac Enzymes: No results for input(s): CKTOTAL, CKMB, CKMBINDEX, TROPONINI in the last 168 hours. BNP: Invalid input(s): POCBNP CBG: Recent Labs  Lab 06/29/18 1129 06/29/18 1606 06/29/18 2039 06/30/18 0020 06/30/18 0805  GLUCAP 170* 213* 144* 148* 123*   D-Dimer No results for input(s): DDIMER in the last 72 hours. Hgb A1c No results for input(s): HGBA1C in the last 72 hours. Lipid Profile No results for input(s): CHOL, HDL, LDLCALC, TRIG, CHOLHDL, LDLDIRECT in the last 72 hours. Thyroid function studies No results for input(s): TSH, T4TOTAL, T3FREE, THYROIDAB in the last 72 hours.  Invalid input(s): FREET3 Anemia work up No results for input(s): VITAMINB12, FOLATE, FERRITIN, TIBC, IRON, RETICCTPCT in the last 72 hours. Urinalysis    Component Value Date/Time   COLORURINE YELLOW 06/27/2011 1220   APPEARANCEUR CLEAR 06/27/2011 1220   LABSPEC 1.020 06/27/2011 1220   PHURINE 5.0 06/27/2011 1220   GLUCOSEU NEGATIVE 06/27/2011 1220   HGBUR NEGATIVE 06/27/2011 1220   BILIRUBINUR  NEGATIVE 06/27/2011 1220   KETONESUR NEGATIVE 06/27/2011 1220   PROTEINUR NEGATIVE 06/27/2011 1220   UROBILINOGEN 0.2 06/27/2011 1220   NITRITE NEGATIVE 06/27/2011 1220   LEUKOCYTESUR TRACE (A) 06/27/2011 1220   Sepsis Labs Invalid input(s): PROCALCITONIN,  WBC,  LACTICIDVEN Microbiology Recent Results (from the past 240 hour(s))  MRSA PCR Screening     Status: None   Collection Time: 06/28/18  5:13 AM  Result Value Ref Range Status   MRSA by PCR NEGATIVE NEGATIVE Final    Comment:        The GeneXpert MRSA Assay (FDA approved for NASAL specimens only), is one component of a comprehensive MRSA colonization surveillance program. It is not intended to diagnose MRSA infection nor to guide or monitor treatment for MRSA infections. Performed at [facility], [address] [place], [place] [address]      Time coordinating discharge: 35 minutes   SIGNED:   Alba Cory, MD  [facility] 06/30/2018, 12:04 PM Pager [phone]  If 7PM-7AM, please contact night-coverage [email or web address] Password [email or web address]

## 2018-06-30 NOTE — Progress Notes (Signed)
Orthopaedic Trauma Progress Note  S: Confused and agitated. Saying "Get these kids out of my house".   O:  Vitals:   06/29/18 2040 06/30/18 0518  BP: (!) 141/69 (!) 150/63  Pulse: 83 70  Resp:    Temp: 98.4 F (36.9 C) 97.8 F (36.6 C)  SpO2: 99% 100%   No acute distress.   Right lower extremity: Dressings are clean dry and intact.  Compartments are soft and compressible.  Refuses to move leg.  Unable to respond appropriately for sensation questions.  Imaging: Stable post op imaging  Labs:  Results for orders placed or performed during the hospital encounter of 06/26/18 (from the past 24 hour(s))  Glucose, capillary     Status: Abnormal   Collection Time: 06/29/18  7:28 AM  Result Value Ref Range   Glucose-Capillary 151 (H) 70 - 99 mg/dL  Glucose, capillary     Status: Abnormal   Collection Time: 06/29/18 11:29 AM  Result Value Ref Range   Glucose-Capillary 170 (H) 70 - 99 mg/dL  Glucose, capillary     Status: Abnormal   Collection Time: 06/29/18  4:06 PM  Result Value Ref Range   Glucose-Capillary 213 (H) 70 - 99 mg/dL  Glucose, capillary     Status: Abnormal   Collection Time: 06/29/18  8:39 PM  Result Value Ref Range   Glucose-Capillary 144 (H) 70 - 99 mg/dL   Comment 1 Document in Chart   Glucose, capillary     Status: Abnormal   Collection Time: 06/30/18 12:20 AM  Result Value Ref Range   Glucose-Capillary 148 (H) 70 - 99 mg/dL   Comment 1 Document in Chart     Assessment: 84 year old female s/p mechanical fall  Injuries: Right periprosthetic distal femur fracture  Weightbearing: WBAT RLE  Insicional and dressing care: Dressing change POD3  Orthopedic device(s):None needed, patient does not need knee immobilizer  CV/Blood loss: Hgb pending this AM, acute blood loss anemia and dilution from IVF. Continue to monitor, currently hemodynamically stable  Pain management: 1. Norco 5/325mg  q 6hours PRN 2. Morphine 0.5mg  q 2hr PRN pain 3. Tylenol 650 mg q 6hrs  PRN pain  Attempted to limit narcotic meds due to dementia  VTE prophylaxis: Aspirin 325 mg daily  ID: Ancef 2 gm postoperatively-completed  Foley/Lines: No foley, NS at 75 ml/hr  Medical co-morbidities: 1. A-fib-no anticoagulation, currently on metoprolol 2. Hypothyroidism-Continue synthroid 3. Diabetes-sliding scale insulin 4. Dementia  Impediments to Fracture Healing: 1. Osteoporosis 2. hypothyroidism  Dispo: PT/OT eval likely SNF  Follow - up plan: Follow up with me in 2 weeks for suture removal and x-rays  Roby Lofts, MD [facility] [phone] (phone)

## 2018-07-01 DIAGNOSIS — F05 Delirium due to known physiological condition: Secondary | ICD-10-CM

## 2018-07-01 DIAGNOSIS — S72401A Unspecified fracture of lower end of right femur, initial encounter for closed fracture: Secondary | ICD-10-CM

## 2018-07-01 DIAGNOSIS — I480 Paroxysmal atrial fibrillation: Secondary | ICD-10-CM

## 2018-07-01 DIAGNOSIS — N183 Chronic kidney disease, stage 3 (moderate): Secondary | ICD-10-CM

## 2018-07-01 DIAGNOSIS — Z66 Do not resuscitate: Secondary | ICD-10-CM

## 2018-07-01 DIAGNOSIS — R627 Adult failure to thrive: Secondary | ICD-10-CM

## 2018-07-01 LAB — GLUCOSE, CAPILLARY
Glucose-Capillary: 149 mg/dL — ABNORMAL HIGH (ref 70–99)
Glucose-Capillary: 191 mg/dL — ABNORMAL HIGH (ref 70–99)
Glucose-Capillary: 67 mg/dL — ABNORMAL LOW (ref 70–99)
Glucose-Capillary: 93 mg/dL (ref 70–99)
Glucose-Capillary: 94 mg/dL (ref 70–99)

## 2018-07-01 MED ORDER — CLONAZEPAM 0.25 MG PO TBDP: 0.2500 mg | ORAL_TABLET | Freq: Three times a day (TID) | ORAL | 0 refills | Status: AC | PRN

## 2018-07-01 MED ORDER — CLONAZEPAM 0.25 MG PO TBDP: 0.2500 mg | ORAL_TABLET | Freq: Three times a day (TID) | ORAL | Status: DC | PRN

## 2018-07-01 MED ORDER — CLONAZEPAM 0.25 MG PO TBDP
0.2500 mg | ORAL_TABLET | Freq: Once | ORAL | Status: AC
  Administered 2018-07-01: 0.25 mg via ORAL
  Filled 2018-07-01: qty 1

## 2018-07-01 NOTE — Progress Notes (Addendum)
Physical Therapy Treatment Patient Details Name: [name] MRN: [id number] DOB: 1926/02/24 Today's Date: 07/01/2018    History of Present Illness [name] is a 84 y.o. female with medical history significant for coronary artery disease status post CABG, paroxysmal atrial flutter no longer on anticoagulation due to recurrent falls, hypothyroidism, hypertension, chronic kidney disease stage III, and diet-controlled diabetes, now presenting to the emergency department with right knee pain after a fall at home. Underwent ORIF for R distal femur fx on 06/28/18.    PT Comments    Pt agitated on arrival but able to redirect to functional mobility.  Pt following commands better and more recpetive to mobility.  Pt performed multiple transfers, including toilet transfer, standing at sink to wash hand and from various surface heights.  Pt required resting on elbows to wash hands.  Pt continues to benefit from skilled rehab to improve strength and function.     Follow Up Recommendations  SNF;Supervision/Assistance - 24 hour     Equipment Recommendations  None recommended by PT    Recommendations for Other Services       Precautions / Restrictions Precautions Precautions: Fall Restrictions Weight Bearing Restrictions: Yes RLE Weight Bearing: Weight bearing as tolerated    Mobility  Bed Mobility Overal bed mobility: Needs Assistance Bed Mobility: Supine to Sit     Supine to sit: Min guard     General bed mobility comments: Pt sitting edge of chair but refusing to stand to place under garments and gowns for d/c home.  Pt require min assist to elevate trunk from back of chair.  Performed multiple times due to cognition.    Transfers Overall transfer level: Needs assistance Equipment used: (2 person HHA at edge of recliner chair.  ) Transfers: Sit to/from Stand Sit to Stand: Mod assist;+2 physical assistance         General transfer comment: Pt refused to attempt to stand  with cues so required assistance to achieve standing.  Moderate assistance with innability to achieve full standing.  Able to place weight on B feet to perform Lower body dressing.    Ambulation/Gait Ambulation/Gait assistance: (NT)               Stairs             Wheelchair Mobility    Modified Rankin (Stroke Patients Only)       Balance Overall balance assessment: Needs assistance;History of Falls   Sitting balance-Leahy Scale: Fair Sitting balance - Comments: able to maintain sitting edge of bed.       Standing balance-Leahy Scale: Poor                              Cognition Arousal/Alertness: Awake/alert Behavior During Therapy: Anxious;Agitated Overall Cognitive Status: History of cognitive impairments - at baseline                                 General Comments: Pt more distracted this afternoon and required assistance to stand and dress patient to get ready for d/c home.        Exercises      General Comments        Pertinent Vitals/Pain Pain Assessment: Faces Faces Pain Scale: Hurts even more Pain Location: RLE Pain Descriptors / Indicators: Operative site guarding;Sore Pain Intervention(s): Monitored during session;Repositioned    Home Living  Prior Function            PT Goals (current goals can now be found in the care plan section) Acute Rehab PT Goals Patient Stated Goal: none stated Potential to Achieve Goals: Good Progress towards PT goals: Progressing toward goals    Frequency    Min 3X/week      PT Plan Current plan remains appropriate    Co-evaluation              AM-PAC PT "6 Clicks" Daily Activity  Outcome Measure  Difficulty turning over in bed (including adjusting bedclothes, sheets and blankets)?: Unable Difficulty moving from lying on back to sitting on the side of the bed? : Unable Difficulty sitting down on and standing up from a chair with  arms (e.g., wheelchair, bedside commode, etc,.)?: Unable Help needed moving to and from a bed to chair (including a wheelchair)?: A Lot Help needed walking in hospital room?: Total Help needed climbing 3-5 steps with a railing? : Total 6 Click Score: 7    End of Session Equipment Utilized During Treatment: Gait belt Activity Tolerance: Patient tolerated treatment well Patient left: in chair;with call bell/phone within reach;with chair alarm set;with family/visitor present Nurse Communication: Mobility status PT Visit Diagnosis: Unsteadiness on feet (R26.81);Repeated falls (R29.6);Pain;Difficulty in walking, not elsewhere classified (R26.2) Pain - Right/Left: Right Pain - part of body: Knee     Time: 1222-1238 PT Time Calculation (min) (ACUTE ONLY): 16 min  Charges:  $Therapeutic Activity: 8-22 mins                     Joycelyn Rua, PTA pager [phone]    Florestine Avers 07/01/2018, 2:20 PM

## 2018-07-01 NOTE — Progress Notes (Signed)
Patient's daughter Clint Lipps in room at this time.  Patient remains combative and agitated.  Lea states that she would prefer to take her mother home rather than have her go to rehab.  Will contact MD as well as case management in this regard.

## 2018-07-01 NOTE — Progress Notes (Signed)
Patient becoming very uncooperative and argumentative with staff.  Patient attempting to get out of bed unassisted.  She is kicking, hitting and attempting to bite staff when trying to assist patient.  MD contacted with request for prn medication

## 2018-07-01 NOTE — Progress Notes (Addendum)
PROGRESS NOTE  [name] ZOX:[id number] DOB: 12/10/1925 DOA: 06/26/2018 PCP: Henrine Screws, MD  HPI/Recap of past 24 hours:  Patient is hard of hearing, oriented to person and the year, not oriented to place and the month She has Intermittent agitation, daughter at bedside Daughter request to bring patient home instead of snf  Assessment/Plan: Principal Problem:   Closed fracture of distal end of right femur [facility]) Active Problems:   Coronary artery disease involving coronary bypass graft of native heart with angina pectoris (HCC)   Essential hypertension, benign   Adult hypothyroidism   Diabetes mellitus, type 2 (HCC)   Paroxysmal atrial flutter   CKD (chronic kidney disease), stage III (HCC)   Prolonged QT interval   Closed fracture of right femur, initial encounter (HCC)  Right periprosthetic distal femur fracture  -Open reduction and fixation of right distal femur. Removal of hardware of right femur. On 9/1 by ortho Dr Jena Gauss -Weightbearing: WBAT RLE, Pain management. DVT prophylaxisper ortho; now on aspirin 325 daily.  -Started  bowel regimen. Miralax.  -PT recommending SNF, daughter elected to take patient home due to h/o dementia Stable for discharge.  -f/u with ortho in two weeks  Acute blood loss anemia.  Post sx hb decrease from 12 to 8.  hb has been stable around 8.4. Discharge on iron supplement.  pcp to monitor hgb  H/o Paroxysmal A fib with history of bradycardia;  not on anticoagulation, per daughter, eliquis give patient profuse diarrhea, daughter understand the risk of benefit of anticoagulation in the setting of paf.  Continue with metoprolol.( patient is noted to have bradycardia during sleep) She is to continue follow with cardiology Dr Katrinka Blazing    Prolong Qt  Qtc 580 on presentation, repeat qtc now back to 449   HTN; Resume norvasc and HCTZ, continue lopressor.  Diet controlled DM; Not on meds at home SSI in the hospital  CKD  stage III;  Monitor Monitor renal function.  Stable.   Hypothyroidism;continue with synthroid. Increase dose TSHat 26  F/u with pcp  Delirium with likely baseline dementia In setting of acute illness. Stable.  She is discharged home with prn klonopin ( total of 5 tabs prescribed), hopefull patient will improve once get back to her own environment.      Code Status: DNR, confirmed with daughter at bedside  Family Communication: patient and daughter at bedside  Disposition Plan:  [facility] with [facility] D[facility] today, d/c summary done by Dr Sunnie Nielsen  Daughter declined snf placement   Consultants:  ortho  Procedures: Procedures: 1. CPT 27511-Open reduction internal fixation of right distal femur 2. CPT 20680-Removal of hardware right femur On 9/1 by ortho dr Jena Gauss  Antibiotics:  perioperative   Objective: BP (!) 169/65 (BP Location: Right Arm)   Pulse 64   Temp 97.6 F (36.4 C) (Axillary)   Resp 20   Ht 5\' 2"  (1.575 m)   Wt 58.5 kg   SpO2 94%   BMI 23.59 kg/m   Intake/Output Summary (Last 24 hours) at 07/01/2018 1138 Last data filed at 07/01/2018 0500 Gross per 24 hour  Intake 240 ml  Output -  Net 240 ml   Filed Weights   06/26/18 2313  Weight: 58.5 kg    Exam: Patient is examined daily including today on 07/01/2018, exams remain the same as of yesterday except that has changed    General:  NAD, hard of hearing, oriented to person and the year, not to the  months, not to place, intermittent agitation  Cardiovascular: RRR  Respiratory: CTABL  Abdomen: Soft/ND/NT, positive BS  Musculoskeletal: No Edema  Neuro: alert,   Data Reviewed: Basic Metabolic Panel: Recent Labs  Lab 06/27/18 0235 06/28/18 1258 06/29/18 0356 06/30/18 0956  NA 142 141 140 143  K 3.9 4.2 3.9 3.6  CL 105 105 106 109  CO2 25 27 25 25   GLUCOSE 191* 164* 181* 142*  BUN 34* 20 21 24*  CREATININE 1.16* 0.96 1.09* 0.96  CALCIUM 9.2 8.8* 8.3* 8.6*    MG  --  2.0 1.9  --    Liver Function Tests: No results for input(s): AST, ALT, ALKPHOS, BILITOT, PROT, ALBUMIN in the last 168 hours. No results for input(s): LIPASE, AMYLASE in the last 168 hours. No results for input(s): AMMONIA in the last 168 hours. CBC: Recent Labs  Lab 06/27/18 0235 06/29/18 0356 06/30/18 0956  WBC 13.0* 11.4* 11.3*  NEUTROABS 10.5*  --   --   HGB 12.5 8.4* 8.4*  HCT 38.8 26.3* 26.5*  MCV 93.3 93.6 93.6  PLT 193 145* 157   Cardiac Enzymes:   No results for input(s): CKTOTAL, CKMB, CKMBINDEX, TROPONINI in the last 168 hours. BNP (last 3 results) No results for input(s): BNP in the last 8760 hours.  ProBNP (last 3 results) No results for input(s): PROBNP in the last 8760 hours.  CBG: Recent Labs  Lab 06/30/18 1828 06/30/18 1938 07/01/18 0013 07/01/18 0440 07/01/18 0744  GLUCAP 69* 115* 149* 93 191*    Recent Results (from the past 240 hour(s))  MRSA PCR Screening     Status: None   Collection Time: 06/28/18  5:13 AM  Result Value Ref Range Status   MRSA by PCR NEGATIVE NEGATIVE Final    Comment:        The GeneXpert MRSA Assay (FDA approved for NASAL specimens only), is one component of a comprehensive MRSA colonization surveillance program. It is not intended to diagnose MRSA infection nor to guide or monitor treatment for MRSA infections. Performed at [facility], [address], [place], [place] [address]      Studies: No results found.  Scheduled Meds: . amLODipine  2.5 mg Oral Daily  . aspirin  325 mg Oral Daily  . feeding supplement  237 mL Oral BID BM  . ferrous sulfate  325 mg Oral BID WC  . insulin aspart  0-9 Units Subcutaneous Q4H  . levothyroxine  125 mcg Oral QAC breakfast  . metoprolol tartrate  12.5 mg Oral BID  . polyethylene glycol  17 g Oral BID  . senna-docusate  1 tablet Oral BID  . simvastatin  10 mg Oral q1800    Continuous Infusions: . methocarbamol (ROBAXIN) IV       Time spent: 35  mins, more than 50% time spent on coordination of care I have personally reviewed and interpreted on  07/01/2018 daily labs, tele strips, imagings as discussed above under date review session and assessment and plans.  I reviewed all nursing notes, pharmacy notes, consultant notes,  vitals, pertinent old records  I have discussed plan of care as described above with RN , patient and family on 07/01/2018   Albertine Grates MD, PhD  [facility] Pager [phone]. If 7PM-7AM, please contact night-coverage at [email or web address], password [facility] 07/01/2018, 11:38 AM  LOS: 4 days

## 2018-07-01 NOTE — Plan of Care (Signed)
  Problem: Activity: Goal: Risk for activity intolerance will decrease 07/01/2018 1124 by Darreld Mclean, RN Outcome: Progressing 07/01/2018 1114 by Darreld Mclean, RN Outcome: Progressing   Problem: Nutrition: Goal: Adequate nutrition will be maintained 07/01/2018 1124 by Darreld Mclean, RN Outcome: Progressing 07/01/2018 1114 by Darreld Mclean, RN Outcome: Progressing   Problem: Coping: Goal: Level of anxiety will decrease 07/01/2018 1124 by Darreld Mclean, RN Outcome: Progressing 07/01/2018 1114 by Darreld Mclean, RN Outcome: Progressing   Problem: Elimination: Goal: Will not experience complications related to bowel motility 07/01/2018 1124 by Darreld Mclean, RN Outcome: Progressing 07/01/2018 1114 by Darreld Mclean, RN Outcome: Progressing Goal: Will not experience complications related to urinary retention 07/01/2018 1124 by Darreld Mclean, RN Outcome: Progressing 07/01/2018 1114 by Darreld Mclean, RN Outcome: Progressing   Problem: Pain Managment: Goal: General experience of comfort will improve 07/01/2018 1124 by Darreld Mclean, RN Outcome: Progressing 07/01/2018 1114 by Darreld Mclean, RN Outcome: Progressing   Problem: Safety: Goal: Ability to remain free from injury will improve 07/01/2018 1124 by Darreld Mclean, RN Outcome: Progressing 07/01/2018 1114 by Darreld Mclean, RN Outcome: Progressing   Problem: Skin Integrity: Goal: Risk for impaired skin integrity will decrease 07/01/2018 1124 by Darreld Mclean, RN Outcome: Progressing 07/01/2018 1114 by Darreld Mclean, RN Outcome: Progressing

## 2018-07-01 NOTE — Care Management Note (Addendum)
Case Management Note  Patient Details  Name: ERINI SARKISSIAN MRN: [id number] Date of Birth: 04-Jun-1926  Subjective/Objective:   Patient is 84 yr old female s/p ORIF of right femur fracture.                 Action/Plan: Case manager spoke with patient's daughter concerning discharge plan and DME. Patient is very agitated at this time. Patient's daughter, Jolyne Loa says she will care for her mom at discharge with family support. She has RW and 3in1 at [facility] bed and Wheelchair. Choice for Home Health agency given, referral given to Dara Hoyer, [facility]. Lea's contact [phone].       Expected Discharge Date:  06/30/18               Expected Discharge Plan:  Home w Home Health Services  In-House Referral:  NA  Discharge planning Services  CM Consult  Post Acute Care Choice:  Home Health Choice offered to:  Adult Children  DME Arranged:  (Has RW and 3in1) Hospital Bed, Wheelchair DME Agency:  [facility] Arranged:  PT, OT, Nurse's Aide HH Agency:  [facility]  Status of Service:  Completed, signed off  If discussed at [employer], dates discussed:    Additional Comments:  Durenda Guthrie, RN 07/01/2018, 11:01 AM

## 2018-07-01 NOTE — Care Management (Signed)
Case manager arranged for [facility] transport home.

## 2018-07-01 NOTE — Progress Notes (Addendum)
CSW spoke with the patient's daughter,Lea Mayford Knife. Patient's daughter expressed  that her mother is  becoming uncooperative and combative, therefore, she decided it was best to take her home. CSW discussed with daughter the importance of family support, self -care and utilization of resources in the community, if needed. CSW informed the [facility] and SNF.  CSW signing off, further social work intervention is no longer needed.  Antony Blackbird, [facility] Clinical Social Worker [phone]

## 2018-07-01 NOTE — Progress Notes (Signed)
Physical Therapy Treatment Patient Details Name: [name] MRN: [id number] DOB: [birth year] Today's Date: 07/01/2018    History of Present Illness [name] is a 84 y.o. female with medical history significant for coronary artery disease status post CABG, paroxysmal atrial flutter no longer on anticoagulation due to recurrent falls, hypothyroidism, hypertension, chronic kidney disease stage III, and diet-controlled diabetes, now presenting to the emergency department with right knee pain after a fall at home. Underwent ORIF for R distal femur fx on 06/28/18.    PT Comments    Re-enter to assist nursing with transfer to perform lower body dressing to prepare for d/c home.  Pt continues to benefit from skilled rehab as she continues to require +2 assistance.    Follow Up Recommendations  SNF;Supervision/Assistance - 24 hour     Equipment Recommendations  None recommended by PT    Recommendations for Other Services       Precautions / Restrictions Precautions Precautions: Fall Restrictions Weight Bearing Restrictions: Yes RLE Weight Bearing: Weight bearing as tolerated    Mobility  Bed Mobility Overal bed mobility: Needs Assistance Bed Mobility: Supine to Sit     Supine to sit: Min guard     General bed mobility comments: Pt sitting edge of chair but refusing to stand to place under garments and gowns for d/c home.  Pt require min assist to elevate trunk from back of chair.  Performed multiple times due to cognition.    Transfers Overall transfer level: Needs assistance Equipment used: (2 person HHA at edge of recliner chair.  ) Transfers: Sit to/from Stand Sit to Stand: Mod assist;+2 physical assistance         General transfer comment: Pt refused to attempt to stand with cues so required assistance to achieve standing.  Moderate assistance with innability to achieve full standing.  Able to place weight on B feet to perform Lower body dressing.     Ambulation/Gait Ambulation/Gait assistance: (NT)               Stairs             Wheelchair Mobility    Modified Rankin (Stroke Patients Only)       Balance Overall balance assessment: Needs assistance;History of Falls   Sitting balance-Leahy Scale: Fair Sitting balance - Comments: able to maintain sitting edge of bed.       Standing balance-Leahy Scale: Poor                              Cognition Arousal/Alertness: Awake/alert Behavior During Therapy: Anxious;Agitated Overall Cognitive Status: History of cognitive impairments - at baseline                                 General Comments: Pt more distracted this afternoon and required assistance to stand and dress patient to get ready for d/c home.        Exercises      General Comments        Pertinent Vitals/Pain Pain Assessment: Faces Faces Pain Scale: Hurts even more Pain Location: RLE Pain Descriptors / Indicators: Operative site guarding;Sore Pain Intervention(s): Monitored during session;Repositioned    Home Living                      Prior Function  PT Goals (current goals can now be found in the care plan section) Acute Rehab PT Goals Patient Stated Goal: none stated Potential to Achieve Goals: Good Progress towards PT goals: Progressing toward goals    Frequency    Min 3X/week      PT Plan Current plan remains appropriate    Co-evaluation              AM-PAC PT "6 Clicks" Daily Activity  Outcome Measure  Difficulty turning over in bed (including adjusting bedclothes, sheets and blankets)?: Unable Difficulty moving from lying on back to sitting on the side of the bed? : Unable Difficulty sitting down on and standing up from a chair with arms (e.g., wheelchair, bedside commode, etc,.)?: Unable Help needed moving to and from a bed to chair (including a wheelchair)?: A Lot Help needed walking in hospital room?:  Total Help needed climbing 3-5 steps with a railing? : Total 6 Click Score: 7    End of Session Equipment Utilized During Treatment: Gait belt Activity Tolerance: Patient tolerated treatment well Patient left: in chair;with call bell/phone within reach;with chair alarm set;with family/visitor present Nurse Communication: Mobility status PT Visit Diagnosis: Unsteadiness on feet (R26.81);Repeated falls (R29.6);Pain;Difficulty in walking, not elsewhere classified (R26.2) Pain - Right/Left: Right Pain - part of body: Knee     Time: 1610-9604 PT Time Calculation (min) (ACUTE ONLY): 17 min  Charges:  $Therapeutic Activity: 8-22 mins                     Joycelyn Rua, PTA pager [phone]    Florestine Avers 07/01/2018, 2:15 PM

## 2018-07-01 NOTE — Plan of Care (Signed)

## 2018-07-01 NOTE — Progress Notes (Signed)
    Durable Medical Equipment  (From admission, onward)         Start     Ordered   07/01/18 1113  For home use only DME lightweight manual wheelchair with seat cushion  (Wheelchairs)  Once    Comments:  Patient suffers from hip fracture which impairs their ability to perform daily activities like bathing in the home.  A walker will not resolve  issue with performing activities of daily living. A wheelchair will allow patient to safely perform daily activities. Patient is not able to propel themselves in the home using a standard weight wheelchair due to general weakness. Patient can self propel in the lightweight wheelchair.  Accessories: elevating leg rests (ELRs), wheel locks, extensions and anti-tippers.   07/01/18 1116   07/01/18 1111  For home use only DME standard manual wheelchair with seat cushion  Once    Comments:  Patient suffers from  Right femur ORIF which impairs their ability to perform daily activities like A ambulating in the home.  A cane will not resolve  issue with performing activities of daily living. A wheelchair will allow patient to safely perform daily activities. Patient can safely propel the wheelchair in the home or has a caregiver who can provide assistance.  Accessories: elevating leg rests (ELRs), wheel locks, extensions and anti-tippers.   07/01/18 1111   07/01/18 1110  For home use only DME Hospital bed  Once    Question Answer Comment  The above medical condition requires: Patient requires the ability to reposition frequently   Head must be elevated greater than: 30 degrees   Bed type Semi-electric      07/01/18 1111   07/01/18 0000  DME Hospital bed    Question Answer Comment  The above medical condition requires: Patient requires the ability to reposition frequently   Head must be elevated greater than: 30 degrees   Bed type Semi-electric      07/01/18 1116

## 2018-07-03 ENCOUNTER — Telehealth (INDEPENDENT_AMBULATORY_CARE_PROVIDER_SITE_OTHER): Payer: Self-pay

## 2018-07-03 NOTE — Telephone Encounter (Signed)
[name] with [facility] wanted to let Dr. Roda Shutters know that they will start HHPT on Monday, 07/06/2018, per patient request.

## 2018-07-03 NOTE — Telephone Encounter (Signed)
Just an FYI

## 2018-07-07 ENCOUNTER — Telehealth (INDEPENDENT_AMBULATORY_CARE_PROVIDER_SITE_OTHER): Payer: Self-pay

## 2018-07-07 NOTE — Telephone Encounter (Signed)
[name] with [facility] called stating that they will be going out to patient's home on Wednesday, 07/08/2018.  Patient's family had delayed start of service for patient.

## 2018-07-08 NOTE — Telephone Encounter (Signed)
===   FYI Only ==

## 2018-08-21 DIAGNOSIS — N183 Chronic kidney disease, stage 3 (moderate): Secondary | ICD-10-CM | POA: Diagnosis not present

## 2018-08-21 DIAGNOSIS — S72401D Unspecified fracture of lower end of right femur, subsequent encounter for closed fracture with routine healing: Secondary | ICD-10-CM | POA: Diagnosis not present

## 2018-08-21 DIAGNOSIS — E039 Hypothyroidism, unspecified: Secondary | ICD-10-CM | POA: Diagnosis not present

## 2018-08-21 DIAGNOSIS — I2510 Atherosclerotic heart disease of native coronary artery without angina pectoris: Secondary | ICD-10-CM | POA: Diagnosis not present

## 2018-08-21 DIAGNOSIS — D508 Other iron deficiency anemias: Secondary | ICD-10-CM | POA: Diagnosis not present

## 2018-08-21 DIAGNOSIS — I10 Essential (primary) hypertension: Secondary | ICD-10-CM | POA: Diagnosis not present

## 2018-09-10 DIAGNOSIS — Z0000 Encounter for general adult medical examination without abnormal findings: Secondary | ICD-10-CM | POA: Diagnosis not present

## 2018-09-10 DIAGNOSIS — R300 Dysuria: Secondary | ICD-10-CM | POA: Diagnosis not present

## 2018-10-07 ENCOUNTER — Encounter: Payer: Medicare Other | Admitting: Podiatry

## 2018-10-14 NOTE — Progress Notes (Signed)
This encounter was created in error - please disregard.

## 2019-07-09 DIAGNOSIS — Z23 Encounter for immunization: Secondary | ICD-10-CM | POA: Diagnosis not present

## 2019-07-09 DIAGNOSIS — E039 Hypothyroidism, unspecified: Secondary | ICD-10-CM | POA: Diagnosis not present

## 2019-07-09 DIAGNOSIS — E119 Type 2 diabetes mellitus without complications: Secondary | ICD-10-CM | POA: Diagnosis not present

## 2019-07-09 DIAGNOSIS — E785 Hyperlipidemia, unspecified: Secondary | ICD-10-CM | POA: Diagnosis not present

## 2019-07-09 DIAGNOSIS — I2510 Atherosclerotic heart disease of native coronary artery without angina pectoris: Secondary | ICD-10-CM | POA: Diagnosis not present

## 2019-07-09 DIAGNOSIS — I10 Essential (primary) hypertension: Secondary | ICD-10-CM | POA: Diagnosis not present

## 2020-10-05 DIAGNOSIS — E039 Hypothyroidism, unspecified: Secondary | ICD-10-CM | POA: Diagnosis not present

## 2020-10-05 DIAGNOSIS — F0390 Unspecified dementia without behavioral disturbance: Secondary | ICD-10-CM | POA: Diagnosis not present

## 2020-10-05 DIAGNOSIS — E785 Hyperlipidemia, unspecified: Secondary | ICD-10-CM | POA: Diagnosis not present

## 2020-10-05 DIAGNOSIS — I10 Essential (primary) hypertension: Secondary | ICD-10-CM | POA: Diagnosis not present

## 2020-10-06 DIAGNOSIS — F0390 Unspecified dementia without behavioral disturbance: Secondary | ICD-10-CM | POA: Diagnosis not present

## 2020-10-06 DIAGNOSIS — R278 Other lack of coordination: Secondary | ICD-10-CM | POA: Diagnosis not present

## 2020-10-08 DIAGNOSIS — S0003XA Contusion of scalp, initial encounter: Secondary | ICD-10-CM | POA: Diagnosis not present

## 2020-10-08 DIAGNOSIS — I10 Essential (primary) hypertension: Secondary | ICD-10-CM | POA: Diagnosis not present

## 2020-10-08 DIAGNOSIS — W01190A Fall on same level from slipping, tripping and stumbling with subsequent striking against furniture, initial encounter: Secondary | ICD-10-CM | POA: Diagnosis not present

## 2020-10-08 DIAGNOSIS — S0001XA Abrasion of scalp, initial encounter: Secondary | ICD-10-CM | POA: Diagnosis not present

## 2020-10-08 DIAGNOSIS — F0390 Unspecified dementia without behavioral disturbance: Secondary | ICD-10-CM | POA: Diagnosis not present

## 2020-10-08 DIAGNOSIS — R9082 White matter disease, unspecified: Secondary | ICD-10-CM | POA: Diagnosis not present

## 2020-10-08 DIAGNOSIS — M47812 Spondylosis without myelopathy or radiculopathy, cervical region: Secondary | ICD-10-CM | POA: Diagnosis not present

## 2020-10-09 DIAGNOSIS — R2689 Other abnormalities of gait and mobility: Secondary | ICD-10-CM | POA: Diagnosis not present

## 2020-10-09 DIAGNOSIS — R296 Repeated falls: Secondary | ICD-10-CM | POA: Diagnosis not present

## 2020-10-09 DIAGNOSIS — M6281 Muscle weakness (generalized): Secondary | ICD-10-CM | POA: Diagnosis not present

## 2020-10-09 DIAGNOSIS — F0391 Unspecified dementia with behavioral disturbance: Secondary | ICD-10-CM | POA: Diagnosis not present

## 2020-10-10 DIAGNOSIS — R278 Other lack of coordination: Secondary | ICD-10-CM | POA: Diagnosis not present

## 2020-10-10 DIAGNOSIS — F0390 Unspecified dementia without behavioral disturbance: Secondary | ICD-10-CM | POA: Diagnosis not present

## 2020-10-11 DIAGNOSIS — M6281 Muscle weakness (generalized): Secondary | ICD-10-CM | POA: Diagnosis not present

## 2020-10-11 DIAGNOSIS — R2689 Other abnormalities of gait and mobility: Secondary | ICD-10-CM | POA: Diagnosis not present

## 2020-10-11 DIAGNOSIS — R296 Repeated falls: Secondary | ICD-10-CM | POA: Diagnosis not present

## 2020-10-11 DIAGNOSIS — F0391 Unspecified dementia with behavioral disturbance: Secondary | ICD-10-CM | POA: Diagnosis not present

## 2020-10-12 DIAGNOSIS — R42 Dizziness and giddiness: Secondary | ICD-10-CM | POA: Diagnosis not present

## 2020-10-12 DIAGNOSIS — I6529 Occlusion and stenosis of unspecified carotid artery: Secondary | ICD-10-CM | POA: Diagnosis not present

## 2020-10-12 DIAGNOSIS — Z7401 Bed confinement status: Secondary | ICD-10-CM | POA: Diagnosis not present

## 2020-10-12 DIAGNOSIS — R6889 Other general symptoms and signs: Secondary | ICD-10-CM | POA: Diagnosis not present

## 2020-10-12 DIAGNOSIS — S0990XA Unspecified injury of head, initial encounter: Secondary | ICD-10-CM | POA: Diagnosis not present

## 2020-10-12 DIAGNOSIS — I6789 Other cerebrovascular disease: Secondary | ICD-10-CM | POA: Diagnosis not present

## 2020-10-12 DIAGNOSIS — I499 Cardiac arrhythmia, unspecified: Secondary | ICD-10-CM | POA: Diagnosis not present

## 2020-10-12 DIAGNOSIS — R519 Headache, unspecified: Secondary | ICD-10-CM | POA: Diagnosis not present

## 2020-10-12 DIAGNOSIS — M4312 Spondylolisthesis, cervical region: Secondary | ICD-10-CM | POA: Diagnosis not present

## 2020-10-12 DIAGNOSIS — Z743 Need for continuous supervision: Secondary | ICD-10-CM | POA: Diagnosis not present

## 2020-10-12 DIAGNOSIS — R404 Transient alteration of awareness: Secondary | ICD-10-CM | POA: Diagnosis not present

## 2020-10-12 DIAGNOSIS — F0390 Unspecified dementia without behavioral disturbance: Secondary | ICD-10-CM | POA: Diagnosis not present

## 2020-10-12 DIAGNOSIS — M2550 Pain in unspecified joint: Secondary | ICD-10-CM | POA: Diagnosis not present

## 2020-10-12 DIAGNOSIS — S51812A Laceration without foreign body of left forearm, initial encounter: Secondary | ICD-10-CM | POA: Diagnosis not present

## 2020-10-12 DIAGNOSIS — I708 Atherosclerosis of other arteries: Secondary | ICD-10-CM | POA: Diagnosis not present

## 2020-10-12 DIAGNOSIS — M47812 Spondylosis without myelopathy or radiculopathy, cervical region: Secondary | ICD-10-CM | POA: Diagnosis not present

## 2020-10-12 DIAGNOSIS — W19XXXA Unspecified fall, initial encounter: Secondary | ICD-10-CM | POA: Diagnosis not present

## 2020-10-13 DIAGNOSIS — R278 Other lack of coordination: Secondary | ICD-10-CM | POA: Diagnosis not present

## 2020-10-13 DIAGNOSIS — F0390 Unspecified dementia without behavioral disturbance: Secondary | ICD-10-CM | POA: Diagnosis not present

## 2020-10-16 DIAGNOSIS — R2689 Other abnormalities of gait and mobility: Secondary | ICD-10-CM | POA: Diagnosis not present

## 2020-10-16 DIAGNOSIS — R296 Repeated falls: Secondary | ICD-10-CM | POA: Diagnosis not present

## 2020-10-16 DIAGNOSIS — F0391 Unspecified dementia with behavioral disturbance: Secondary | ICD-10-CM | POA: Diagnosis not present

## 2020-10-16 DIAGNOSIS — M6281 Muscle weakness (generalized): Secondary | ICD-10-CM | POA: Diagnosis not present

## 2020-10-17 DIAGNOSIS — I10 Essential (primary) hypertension: Secondary | ICD-10-CM | POA: Diagnosis not present

## 2020-10-17 DIAGNOSIS — F0390 Unspecified dementia without behavioral disturbance: Secondary | ICD-10-CM | POA: Diagnosis not present

## 2020-10-17 DIAGNOSIS — R278 Other lack of coordination: Secondary | ICD-10-CM | POA: Diagnosis not present

## 2020-10-17 DIAGNOSIS — E039 Hypothyroidism, unspecified: Secondary | ICD-10-CM | POA: Diagnosis not present

## 2020-10-17 DIAGNOSIS — R451 Restlessness and agitation: Secondary | ICD-10-CM | POA: Diagnosis not present

## 2020-10-17 DIAGNOSIS — L089 Local infection of the skin and subcutaneous tissue, unspecified: Secondary | ICD-10-CM | POA: Diagnosis not present

## 2020-10-20 DIAGNOSIS — M25551 Pain in right hip: Secondary | ICD-10-CM | POA: Diagnosis not present

## 2020-10-20 DIAGNOSIS — R079 Chest pain, unspecified: Secondary | ICD-10-CM | POA: Diagnosis not present

## 2020-10-22 DIAGNOSIS — F0390 Unspecified dementia without behavioral disturbance: Secondary | ICD-10-CM | POA: Diagnosis not present

## 2020-10-22 DIAGNOSIS — S51812A Laceration without foreign body of left forearm, initial encounter: Secondary | ICD-10-CM | POA: Diagnosis not present

## 2020-10-22 DIAGNOSIS — R58 Hemorrhage, not elsewhere classified: Secondary | ICD-10-CM | POA: Diagnosis not present

## 2020-10-22 DIAGNOSIS — F29 Unspecified psychosis not due to a substance or known physiological condition: Secondary | ICD-10-CM | POA: Diagnosis not present

## 2020-10-22 DIAGNOSIS — W19XXXA Unspecified fall, initial encounter: Secondary | ICD-10-CM | POA: Diagnosis not present

## 2020-10-22 DIAGNOSIS — I6381 Other cerebral infarction due to occlusion or stenosis of small artery: Secondary | ICD-10-CM | POA: Diagnosis not present

## 2020-10-22 DIAGNOSIS — R296 Repeated falls: Secondary | ICD-10-CM | POA: Diagnosis not present

## 2020-10-22 DIAGNOSIS — Z743 Need for continuous supervision: Secondary | ICD-10-CM | POA: Diagnosis not present

## 2020-10-22 DIAGNOSIS — M47812 Spondylosis without myelopathy or radiculopathy, cervical region: Secondary | ICD-10-CM | POA: Diagnosis not present

## 2020-10-22 DIAGNOSIS — Z046 Encounter for general psychiatric examination, requested by authority: Secondary | ICD-10-CM | POA: Diagnosis not present

## 2020-10-22 DIAGNOSIS — R456 Violent behavior: Secondary | ICD-10-CM | POA: Diagnosis not present

## 2020-10-22 DIAGNOSIS — R4689 Other symptoms and signs involving appearance and behavior: Secondary | ICD-10-CM | POA: Diagnosis not present

## 2020-10-22 DIAGNOSIS — I6782 Cerebral ischemia: Secondary | ICD-10-CM | POA: Diagnosis not present

## 2020-10-24 DIAGNOSIS — R451 Restlessness and agitation: Secondary | ICD-10-CM | POA: Diagnosis not present

## 2020-10-24 DIAGNOSIS — R296 Repeated falls: Secondary | ICD-10-CM | POA: Diagnosis not present

## 2020-10-24 DIAGNOSIS — F0390 Unspecified dementia without behavioral disturbance: Secondary | ICD-10-CM | POA: Diagnosis not present

## 2020-10-26 DIAGNOSIS — Z743 Need for continuous supervision: Secondary | ICD-10-CM | POA: Diagnosis not present

## 2020-10-26 DIAGNOSIS — R9431 Abnormal electrocardiogram [ECG] [EKG]: Secondary | ICD-10-CM | POA: Diagnosis not present

## 2020-10-26 DIAGNOSIS — R279 Unspecified lack of coordination: Secondary | ICD-10-CM | POA: Diagnosis not present

## 2020-10-26 DIAGNOSIS — R531 Weakness: Secondary | ICD-10-CM | POA: Diagnosis not present

## 2020-10-26 DIAGNOSIS — R002 Palpitations: Secondary | ICD-10-CM | POA: Diagnosis not present

## 2020-10-26 DIAGNOSIS — F0281 Dementia in other diseases classified elsewhere with behavioral disturbance: Secondary | ICD-10-CM | POA: Diagnosis not present

## 2020-10-26 DIAGNOSIS — J189 Pneumonia, unspecified organism: Secondary | ICD-10-CM | POA: Diagnosis not present

## 2020-10-26 DIAGNOSIS — G309 Alzheimer's disease, unspecified: Secondary | ICD-10-CM | POA: Diagnosis not present

## 2020-10-26 DIAGNOSIS — R404 Transient alteration of awareness: Secondary | ICD-10-CM | POA: Diagnosis not present

## 2020-10-26 DIAGNOSIS — F4325 Adjustment disorder with mixed disturbance of emotions and conduct: Secondary | ICD-10-CM | POA: Diagnosis not present

## 2020-10-26 DIAGNOSIS — F064 Anxiety disorder due to known physiological condition: Secondary | ICD-10-CM | POA: Diagnosis not present

## 2020-10-26 DIAGNOSIS — R6889 Other general symptoms and signs: Secondary | ICD-10-CM | POA: Diagnosis not present

## 2020-10-27 DIAGNOSIS — R002 Palpitations: Secondary | ICD-10-CM | POA: Diagnosis not present

## 2020-10-30 DIAGNOSIS — G300 Alzheimer's disease with early onset: Secondary | ICD-10-CM | POA: Diagnosis not present

## 2020-10-30 DIAGNOSIS — F411 Generalized anxiety disorder: Secondary | ICD-10-CM | POA: Diagnosis not present

## 2020-10-30 DIAGNOSIS — F22 Delusional disorders: Secondary | ICD-10-CM | POA: Diagnosis not present

## 2020-11-07 DIAGNOSIS — Z20828 Contact with and (suspected) exposure to other viral communicable diseases: Secondary | ICD-10-CM | POA: Diagnosis not present

## 2020-11-07 DIAGNOSIS — U071 COVID-19: Secondary | ICD-10-CM | POA: Diagnosis not present

## 2020-11-08 DIAGNOSIS — S0990XA Unspecified injury of head, initial encounter: Secondary | ICD-10-CM | POA: Diagnosis not present

## 2020-11-08 DIAGNOSIS — Y999 Unspecified external cause status: Secondary | ICD-10-CM | POA: Diagnosis not present

## 2020-11-08 DIAGNOSIS — Z743 Need for continuous supervision: Secondary | ICD-10-CM | POA: Diagnosis not present

## 2020-11-08 DIAGNOSIS — M542 Cervicalgia: Secondary | ICD-10-CM | POA: Diagnosis not present

## 2020-11-08 DIAGNOSIS — Z043 Encounter for examination and observation following other accident: Secondary | ICD-10-CM | POA: Diagnosis not present

## 2020-11-08 DIAGNOSIS — W19XXXA Unspecified fall, initial encounter: Secondary | ICD-10-CM | POA: Diagnosis not present

## 2020-11-08 DIAGNOSIS — R52 Pain, unspecified: Secondary | ICD-10-CM | POA: Diagnosis not present

## 2020-11-09 DIAGNOSIS — F0390 Unspecified dementia without behavioral disturbance: Secondary | ICD-10-CM | POA: Diagnosis not present

## 2020-11-09 DIAGNOSIS — R296 Repeated falls: Secondary | ICD-10-CM | POA: Diagnosis not present

## 2020-11-09 DIAGNOSIS — R451 Restlessness and agitation: Secondary | ICD-10-CM | POA: Diagnosis not present

## 2020-11-09 DIAGNOSIS — R4689 Other symptoms and signs involving appearance and behavior: Secondary | ICD-10-CM | POA: Diagnosis not present

## 2020-11-10 DIAGNOSIS — R6889 Other general symptoms and signs: Secondary | ICD-10-CM | POA: Diagnosis not present

## 2020-11-10 DIAGNOSIS — R509 Fever, unspecified: Secondary | ICD-10-CM | POA: Diagnosis not present

## 2020-11-10 DIAGNOSIS — R918 Other nonspecific abnormal finding of lung field: Secondary | ICD-10-CM | POA: Diagnosis not present

## 2020-11-10 DIAGNOSIS — R0902 Hypoxemia: Secondary | ICD-10-CM | POA: Diagnosis not present

## 2020-11-10 DIAGNOSIS — F0390 Unspecified dementia without behavioral disturbance: Secondary | ICD-10-CM | POA: Diagnosis not present

## 2020-11-10 DIAGNOSIS — Z743 Need for continuous supervision: Secondary | ICD-10-CM | POA: Diagnosis not present

## 2020-11-10 DIAGNOSIS — U071 COVID-19: Secondary | ICD-10-CM | POA: Diagnosis not present

## 2020-11-10 DIAGNOSIS — K5900 Constipation, unspecified: Secondary | ICD-10-CM | POA: Diagnosis not present

## 2020-11-10 DIAGNOSIS — R404 Transient alteration of awareness: Secondary | ICD-10-CM | POA: Diagnosis not present

## 2020-11-19 ENCOUNTER — Other Ambulatory Visit: Payer: Self-pay

## 2020-11-19 ENCOUNTER — Emergency Department (HOSPITAL_BASED_OUTPATIENT_CLINIC_OR_DEPARTMENT_OTHER)
Admission: EM | Admit: 2020-11-19 | Discharge: 2020-11-19 | Disposition: A | Discharge status: Home / Self Care | Payer: Medicare Other | Attending: Emergency Medicine | Admitting: Emergency Medicine

## 2020-11-19 ENCOUNTER — Emergency Department (HOSPITAL_BASED_OUTPATIENT_CLINIC_OR_DEPARTMENT_OTHER): Payer: Medicare Other

## 2020-11-19 ENCOUNTER — Encounter (HOSPITAL_BASED_OUTPATIENT_CLINIC_OR_DEPARTMENT_OTHER): Payer: Self-pay | Admitting: Emergency Medicine

## 2020-11-19 DIAGNOSIS — Z79899 Other long term (current) drug therapy: Secondary | ICD-10-CM | POA: Insufficient documentation

## 2020-11-19 DIAGNOSIS — N183 Chronic kidney disease, stage 3 unspecified: Secondary | ICD-10-CM | POA: Insufficient documentation

## 2020-11-19 DIAGNOSIS — W19XXXA Unspecified fall, initial encounter: Secondary | ICD-10-CM

## 2020-11-19 DIAGNOSIS — Z743 Need for continuous supervision: Secondary | ICD-10-CM | POA: Diagnosis not present

## 2020-11-19 DIAGNOSIS — W06XXXA Fall from bed, initial encounter: Secondary | ICD-10-CM | POA: Insufficient documentation

## 2020-11-19 DIAGNOSIS — S0003XA Contusion of scalp, initial encounter: Secondary | ICD-10-CM | POA: Diagnosis not present

## 2020-11-19 DIAGNOSIS — F0390 Unspecified dementia without behavioral disturbance: Secondary | ICD-10-CM | POA: Diagnosis not present

## 2020-11-19 DIAGNOSIS — I25119 Atherosclerotic heart disease of native coronary artery with unspecified angina pectoris: Secondary | ICD-10-CM | POA: Diagnosis not present

## 2020-11-19 DIAGNOSIS — Y92009 Unspecified place in unspecified non-institutional (private) residence as the place of occurrence of the external cause: Secondary | ICD-10-CM | POA: Insufficient documentation

## 2020-11-19 DIAGNOSIS — E1169 Type 2 diabetes mellitus with other specified complication: Secondary | ICD-10-CM | POA: Diagnosis not present

## 2020-11-19 DIAGNOSIS — I5032 Chronic diastolic (congestive) heart failure: Secondary | ICD-10-CM | POA: Diagnosis not present

## 2020-11-19 DIAGNOSIS — I129 Hypertensive chronic kidney disease with stage 1 through stage 4 chronic kidney disease, or unspecified chronic kidney disease: Secondary | ICD-10-CM | POA: Diagnosis not present

## 2020-11-19 DIAGNOSIS — Z951 Presence of aortocoronary bypass graft: Secondary | ICD-10-CM | POA: Diagnosis not present

## 2020-11-19 DIAGNOSIS — R6889 Other general symptoms and signs: Secondary | ICD-10-CM | POA: Diagnosis not present

## 2020-11-19 DIAGNOSIS — Z7401 Bed confinement status: Secondary | ICD-10-CM | POA: Diagnosis not present

## 2020-11-19 DIAGNOSIS — R5381 Other malaise: Secondary | ICD-10-CM | POA: Diagnosis not present

## 2020-11-19 DIAGNOSIS — Z7982 Long term (current) use of aspirin: Secondary | ICD-10-CM | POA: Insufficient documentation

## 2020-11-19 DIAGNOSIS — E039 Hypothyroidism, unspecified: Secondary | ICD-10-CM | POA: Diagnosis not present

## 2020-11-19 DIAGNOSIS — E785 Hyperlipidemia, unspecified: Secondary | ICD-10-CM | POA: Insufficient documentation

## 2020-11-19 DIAGNOSIS — M2550 Pain in unspecified joint: Secondary | ICD-10-CM | POA: Diagnosis not present

## 2020-11-19 DIAGNOSIS — E1122 Type 2 diabetes mellitus with diabetic chronic kidney disease: Secondary | ICD-10-CM | POA: Insufficient documentation

## 2020-11-19 DIAGNOSIS — S0990XA Unspecified injury of head, initial encounter: Secondary | ICD-10-CM | POA: Diagnosis present

## 2020-11-19 DIAGNOSIS — G309 Alzheimer's disease, unspecified: Secondary | ICD-10-CM | POA: Diagnosis not present

## 2020-11-19 DIAGNOSIS — R404 Transient alteration of awareness: Secondary | ICD-10-CM | POA: Diagnosis not present

## 2020-11-19 NOTE — ED Triage Notes (Signed)
Pt here from [facility].

## 2020-11-19 NOTE — ED Notes (Signed)
EDP at bedside  

## 2020-11-19 NOTE — ED Provider Notes (Signed)
[facility] EMERGENCY DEPARTMENT Provider Note   CSN: [id number] Arrival date & time: 11/19/20  0111     History Chief Complaint  Patient presents with  . Fall    [name] is a 87 y.o. female.  The history is provided by the EMS personnel. The history is limited by the condition of the patient.  Fall This is a new problem. The current episode started less than 1 hour ago. The problem occurs constantly. The problem has not changed since onset.Pertinent negatives include no chest pain, no abdominal pain and no shortness of breath. Nothing aggravates the symptoms. Nothing relieves the symptoms. She has tried nothing for the symptoms. The treatment provided no relief.  Larey SeatFell out of bed and hit head.      Past Medical History:  Diagnosis Date  . Bradycardia 09/21/2013   Heart rate of 48 noted on the EKG, while on beta blocker therapy (metoprolol 75 mg twice a day). Medication dosage decrease was undertaken   . CAD (coronary artery disease)   . 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 f/u with Dr Katrinka BlazingSmith, amlodipine decreased. `E1o3L`discussed with Dr Everlene OtherBouska   . Chronic anticoagulation 05/13/2017  . Coronary artery disease involving coronary bypass graft of native heart with angina pectoris (HCC) 09/21/2013   CABG 1993, LIMA to LAD, SVG to OM2 and 3, SVG to PDA   . Diabetes (HCC)   . Diabetes mellitus, type 2 (HCC) 02/15/2014   Last Assessment & Plan:  Diabetes is improving with treatment.  Continue current treatment regimen. Diabetes will be reassessed in 3 months     The goal is for the Hgb A1C to be less than 7.0.  It is recommended that all diabetics are educated on and follow a healthy diabetic diet, exercise for 30 minutes 3-4 times per week (walking, biking, swimming, or machine), monitor blood glucose readings an  . Essential hypertension, benign 09/21/2013  . H/O deep venous thrombosis 10/23/2012   Overview:   hx of dvt tx 2012 with coumadin following surgery   . Hip fracture (HCC)   . History of cardiovascular surgery 10/23/2012   Overview:  1993. CABG> with LIMA to LAD, sequential SVG to oM, SVG to PDA> followed by Dr. Katrinka BlazingSmith.   Marland Kitchen. HTN (hypertension)   . Hypercholesteremia   . Hyperlipidemia   . Hypothyroidism   . Hypothyroidism   . Osteoarthritis   . Osteopenia   . Osteoporosis   . Paroxysmal atrial flutter 05/13/2017    Patient Active Problem List   Diagnosis Date Noted  . Closed fracture of distal end of right femur (HCC) 06/27/2018  . CKD (chronic kidney disease), stage III (HCC) 06/27/2018  . Prolonged QT interval 06/27/2018  . Closed fracture of right femur, initial encounter (HCC) 06/27/2018  . Paroxysmal atrial flutter 05/13/2017  . Diabetes mellitus, type 2 (HCC) 02/15/2014  . Difficulty hearing 02/14/2014  . Problems influencing health status 02/14/2014  . BMI (body mass index), pediatric, 5% to less than 85% for age 36/20/2015  . Coronary artery disease involving coronary bypass graft of native heart with angina pectoris (HCC) 09/21/2013    Class: Chronic  . Essential hypertension, benign 09/21/2013  . Hyperlipidemia 09/21/2013  . Bradycardia 09/21/2013    Class: Chronic  . Adult hypothyroidism 08/17/2013  . Routine general medical examination at a health care facility 08/28/2012    Past Surgical History:  Procedure Laterality Date  . APPENDECTOMY    .  CORONARY ARTERY BYPASS GRAFT     with LIMA to LAD,sequential SVG to OM # 1 & 3, SVG to PDA. All grafts patent with normal LV function 2006, followed by cardiologist Dr. Katrinka Blazing  . EYE SURGERY    . FRACTURE SURGERY    . HEMORRHOID SURGERY    . JOINT REPLACEMENT    . ORIF FEMUR FRACTURE Right 06/28/2018   Procedure: OPEN REDUCTION INTERNAL FIXATION (ORIF) DISTAL FEMUR FRACTURE;  Surgeon: Roby Lofts, MD;  Location: [facility] OR;  Service: Orthopedics;  Laterality: Right;     OB History   No obstetric history on file.      Family History  Problem Relation Age of Onset  . Heart disease Mother   . Hyperlipidemia Mother   . Stroke Mother   . Alzheimer's disease Brother     Social History   Tobacco Use  . Smoking status: Never Smoker  . Smokeless tobacco: Never Used  Vaping Use  . Vaping Use: Never used  Substance Use Topics  . Alcohol use: No    Alcohol/week: 0.0 standard drinks  . Drug use: No    Home Medications Prior to Admission medications   Medication Sig Start Date End Date Taking? Authorizing Provider  acetaminophen (TYLENOL) 325 MG tablet Take 2 tablets (650 mg total) by mouth every 6 (six) hours as needed for mild pain or headache. 06/30/18   Regalado, [name] A, MD  amLODipine (NORVASC) 2.5 MG tablet Take 2.5 mg by mouth daily.    [provider]  aspirin 325 MG tablet Take 1 tablet (325 mg total) by mouth daily. 06/30/18   Regalado, [name] A, MD  calcium carbonate 1250 MG capsule Take 1,250 mg by mouth daily.     [provider]  CINNAMON PO Take 1 tablet by mouth daily.    [provider]  clonazePAM (KLONOPIN) 0.25 MG disintegrating tablet Take 1 tablet (0.25 mg total) by mouth 3 (three) times daily as needed (agitation). 07/01/18   Albertine Grates, MD  Cyanocobalamin (VITAMIN B 12 PO) Take 1,000 mg by mouth daily.    [provider]  feeding supplement (ENSURE SURGERY) LIQD Take 237 mLs by mouth 2 (two) times daily between meals. 06/30/18   Regalado, [name] A, MD  ferrous sulfate 325 (65 FE) MG tablet Take 1 tablet (325 mg total) by mouth 2 (two) times daily with a meal. 06/30/18   Regalado, [name] A, MD  folic acid (FOLVITE) 400 MCG tablet Take 400 mcg by mouth daily.    [provider]  hydrochlorothiazide (HYDRODIURIL) 25 MG tablet Take 25 mg by mouth daily.    [provider]  HYDROcodone-acetaminophen (NORCO/VICODIN) 5-325 MG tablet Take 1 tablet by mouth every 6 (six) hours as needed for moderate pain. 06/30/18   Regalado, [name] A, MD   levothyroxine (SYNTHROID, LEVOTHROID) 125 MCG tablet Take 1 tablet (125 mcg total) by mouth daily before breakfast. 06/30/18   Regalado, [name] A, MD  MAGNESIUM PO Take 1 tablet by mouth daily.    [provider]  metoprolol tartrate (LOPRESSOR) 25 MG tablet Take 0.5 tablets (12.5 mg total) by mouth 2 (two) times daily. 06/30/18   Regalado, [name] A, MD  Multiple Vitamins-Minerals (CENTRUM SILVER PO) Take 1 tablet by mouth daily.    [provider]  Omega-3 1000 MG CAPS Take 1,000 mg by mouth daily.     [provider]  polyethylene glycol (MIRALAX / GLYCOLAX) packet Take 17 g by mouth 2 (two) times daily.  06/30/18   Regalado, [name] A, MD  pyridOXINE (VITAMIN B-6) 100 MG tablet Take 100 mg by mouth daily.    [provider]  senna-docusate (SENOKOT-S) 8.6-50 MG tablet Take 1 tablet by mouth 2 (two) times daily. 06/30/18   Regalado, [name] A, MD  simvastatin (ZOCOR) 10 MG tablet Take 10 mg by mouth daily. 09/11/15   [provider]    Allergies    Ace inhibitors  Review of Systems   Review of Systems  Unable to perform ROS: Dementia  Respiratory: Negative for shortness of breath.   Cardiovascular: Negative for chest pain.  Gastrointestinal: Negative for abdominal pain.  Skin: Negative for wound.    Physical Exam Updated Vital Signs BP (!) 158/70 (BP Location: Right Arm)   Pulse 67   Temp 97.7 F (36.5 C) (Oral)   Resp 20   Wt 55.6 kg   SpO2 99%   BMI 22.41 kg/m   Physical Exam Vitals and nursing note reviewed.  Constitutional:      General: She is not in acute distress.    Appearance: Normal appearance.  HENT:     Head: Normocephalic and atraumatic.     Right Ear: Tympanic membrane normal.     Left Ear: Tympanic membrane normal.     Nose: Nose normal.  Eyes:     Conjunctiva/sclera: Conjunctivae normal.     Pupils: Pupils are equal, round, and reactive to light.  Cardiovascular:     Rate and Rhythm: Normal rate and regular rhythm.      Pulses: Normal pulses.     Heart sounds: Normal heart sounds.  Pulmonary:     Breath sounds: Normal breath sounds.  Abdominal:     General: Abdomen is flat. Bowel sounds are normal.     Palpations: Abdomen is soft.     Tenderness: There is no abdominal tenderness. There is no guarding.  Musculoskeletal:        General: Normal range of motion.     Right elbow: Normal.     Left elbow: Normal.     Right forearm: Normal.     Left forearm: Normal.     Right wrist: Normal.     Left wrist: Normal.     Cervical back: Normal, normal range of motion and neck supple.     Thoracic back: Normal.     Lumbar back: Normal.     Right hip: Normal.     Left hip: Normal.  Skin:    General: Skin is warm and dry.     Capillary Refill: Capillary refill takes less than 2 seconds.  Neurological:     General: No focal deficit present.     Mental Status: She is alert.     Deep Tendon Reflexes: Reflexes normal.  Psychiatric:        Behavior: Behavior normal.     ED Results / Procedures / Treatments   Labs (all labs ordered are listed, but only abnormal results are displayed) Labs Reviewed - No data to display  EKG None  Radiology CT Head Wo Contrast  Result Date: 11/19/2020 CLINICAL DATA:  Fall, head injury, neck injury, scalp hematoma EXAM: CT HEAD WITHOUT CONTRAST CT CERVICAL SPINE WITHOUT CONTRAST TECHNIQUE: Multidetector CT imaging of the head and cervical spine was performed following the standard protocol without intravenous contrast. Multiplanar CT image reconstructions of the cervical spine were also generated. COMPARISON:  None. FINDINGS: CT HEAD FINDINGS Brain: Normal anatomic configuration. Parenchymal volume loss is commensurate with the patient's  age. Mild periventricular white matter changes are present likely reflecting the sequela of small vessel ischemia. No abnormal intra or extra-axial mass lesion or fluid collection. No abnormal mass effect or midline shift. No evidence of  acute intracranial hemorrhage or infarct. Ventricular size is normal. Cerebellum unremarkable. Vascular: No asymmetric hyperdense vasculature at the skull base. Skull: Intact Sinuses/Orbits: There is dense opacification with central high attenuation of the a posterior most right ethmoid air cell with associated thickening and sclerosis of the walls of the affected ethmoid air cell in keeping with changes of chronic sinusitis in this location. Mild mucosal thickening is seen within the right ethmoid air cells otherwise as well as the maxillary sinuses bilaterally. No air-fluid levels. Orbits are unremarkable. Other: Mastoid air cells and middle ear cavities are clear. Small left frontal scalp hematoma is noted superiorly. CT CERVICAL SPINE FINDINGS Alignment: Normal cervical lordosis. Minimal anterolisthesis C4 upon C5, likely degenerative in nature Skull base and vertebrae: The craniocervical junction is unremarkable. The atlantodental interval is normal. No acute fracture of the cervical spine. No lytic or blastic bone lesion. Soft tissues and spinal canal: Spinal canal is widely patent. Small posterior disc herniations noted at C2-3 and C3-4 with mild effacement of the anterior canal space. No canal hematoma. No significant prevertebral soft tissue swelling. No paraspinal fluid collections. Disc levels: Review of the sagittal reformats demonstrates intervertebral disc space narrowing and endplate remodeling at C5-C7, most severe at C6-7, in keeping with changes of moderate to severe degenerative disc disease. Remaining intervertebral disc heights are preserved. Moderate degenerative changes noted at the atlantodental articulation. Vertebral body heights are preserved. Spinal canal is widely patent. Prevertebral soft tissues are not thickened. Review of the axial images demonstrates multilevel predominantly facet arthrosis resulting in mild neural foraminal narrowing on the left at C3-C7 and on the right at C5-6.  No significant canal stenosis. Upper chest: Unremarkable Other: Moderate atherosclerotic calcification is seen within the carotid bifurcations. IMPRESSION: No acute intracranial injury. No calvarial fracture. Small left frontal scalp hematoma. Chronic right ethmoidal sinusitis. No acute fracture or listhesis of the cervical spine. Electronically Signed   By: Helyn NumbersAshesh  Parikh MD   On: 11/19/2020 02:10   CT Cervical Spine Wo Contrast  Result Date: 11/19/2020 CLINICAL DATA:  Fall, head injury, neck injury, scalp hematoma EXAM: CT HEAD WITHOUT CONTRAST CT CERVICAL SPINE WITHOUT CONTRAST TECHNIQUE: Multidetector CT imaging of the head and cervical spine was performed following the standard protocol without intravenous contrast. Multiplanar CT image reconstructions of the cervical spine were also generated. COMPARISON:  None. FINDINGS: CT HEAD FINDINGS Brain: Normal anatomic configuration. Parenchymal volume loss is commensurate with the patient's age. Mild periventricular white matter changes are present likely reflecting the sequela of small vessel ischemia. No abnormal intra or extra-axial mass lesion or fluid collection. No abnormal mass effect or midline shift. No evidence of acute intracranial hemorrhage or infarct. Ventricular size is normal. Cerebellum unremarkable. Vascular: No asymmetric hyperdense vasculature at the skull base. Skull: Intact Sinuses/Orbits: There is dense opacification with central high attenuation of the a posterior most right ethmoid air cell with associated thickening and sclerosis of the walls of the affected ethmoid air cell in keeping with changes of chronic sinusitis in this location. Mild mucosal thickening is seen within the right ethmoid air cells otherwise as well as the maxillary sinuses bilaterally. No air-fluid levels. Orbits are unremarkable. Other: Mastoid air cells and middle ear cavities are clear. Small left frontal scalp hematoma is noted superiorly. CT CERVICAL  SPINE  FINDINGS Alignment: Normal cervical lordosis. Minimal anterolisthesis C4 upon C5, likely degenerative in nature Skull base and vertebrae: The craniocervical junction is unremarkable. The atlantodental interval is normal. No acute fracture of the cervical spine. No lytic or blastic bone lesion. Soft tissues and spinal canal: Spinal canal is widely patent. Small posterior disc herniations noted at C2-3 and C3-4 with mild effacement of the anterior canal space. No canal hematoma. No significant prevertebral soft tissue swelling. No paraspinal fluid collections. Disc levels: Review of the sagittal reformats demonstrates intervertebral disc space narrowing and endplate remodeling at C5-C7, most severe at C6-7, in keeping with changes of moderate to severe degenerative disc disease. Remaining intervertebral disc heights are preserved. Moderate degenerative changes noted at the atlantodental articulation. Vertebral body heights are preserved. Spinal canal is widely patent. Prevertebral soft tissues are not thickened. Review of the axial images demonstrates multilevel predominantly facet arthrosis resulting in mild neural foraminal narrowing on the left at C3-C7 and on the right at C5-6. No significant canal stenosis. Upper chest: Unremarkable Other: Moderate atherosclerotic calcification is seen within the carotid bifurcations. IMPRESSION: No acute intracranial injury. No calvarial fracture. Small left frontal scalp hematoma. Chronic right ethmoidal sinusitis. No acute fracture or listhesis of the cervical spine. Electronically Signed   By: Helyn Numbers MD   On: 11/19/2020 02:10    Procedures Procedures (including critical care time)  Medications Ordered in ED Medications - No data to display  ED Course  I have reviewed the triage vital signs and the nursing notes.  Pertinent labs & imaging results that were available during my care of the patient were reviewed by me and considered in my medical decision  making (see chart for details).    No injuries from fall.  Stable for discharge with close follow up.    [name] was evaluated in Emergency Department on 11/19/2020 for the symptoms described in the history of present illness. She was evaluated in the context of the global COVID-19 pandemic, which necessitated consideration that the patient might be at risk for infection with the SARS-CoV-2 virus that causes COVID-19. Institutional protocols and algorithms that pertain to the evaluation of patients at risk for COVID-19 are in a state of rapid change based on information released by regulatory bodies including the CDC and federal and state organizations. These policies and algorithms were followed during the patient's care in the ED.  Final Clinical Impression(s) / ED Diagnoses Return for intractable cough, coughing up blood, fevers > 100.4 unrelieved by medication, shortness of breath, intractable vomiting, chest pain, shortness of breath, weakness, numbness, changes in speech, facial asymmetry, abdominal pain, passing out, Inability to tolerate liquids or food, cough, altered mental status or any concerns. No signs of systemic illness or infection. The patient is nontoxic-appearing on exam and vital signs are within normal limits.  I have reviewed the triage vital signs and the nursing notes. Pertinent labs & imaging results that were available during my care of the patient were reviewed by me and considered in my medical decision making (see chart for details). After history, exam, and medical workup I feel the patient has been appropriately medically screened and is safe for discharge home. Pertinent diagnoses were discussed with the patient. Patient was given return precautions.     Omere Marti, MD 11/19/20 [id number]

## 2020-11-19 NOTE — ED Notes (Signed)
Attempted to call family again, no answer.   

## 2020-11-19 NOTE — ED Triage Notes (Signed)
BIB [facility] s/p fall from bed. Hematoma to L forehead and L back of head. No other injuries noted. Pt has dementia at baseline. Denies any other complaint. Per EMS no blood thinners.

## 2020-11-19 NOTE — ED Notes (Signed)
Pt to CT

## 2020-11-19 NOTE — ED Notes (Signed)
Attempted to call pt daughter, left voicemail with instructions to call back

## 2020-11-21 DIAGNOSIS — G309 Alzheimer's disease, unspecified: Secondary | ICD-10-CM | POA: Diagnosis not present

## 2020-11-21 DIAGNOSIS — I5032 Chronic diastolic (congestive) heart failure: Secondary | ICD-10-CM | POA: Diagnosis not present

## 2020-11-22 DIAGNOSIS — G309 Alzheimer's disease, unspecified: Secondary | ICD-10-CM | POA: Diagnosis not present

## 2020-11-22 DIAGNOSIS — I5032 Chronic diastolic (congestive) heart failure: Secondary | ICD-10-CM | POA: Diagnosis not present

## 2020-11-23 DIAGNOSIS — I5032 Chronic diastolic (congestive) heart failure: Secondary | ICD-10-CM | POA: Diagnosis not present

## 2020-11-23 DIAGNOSIS — G309 Alzheimer's disease, unspecified: Secondary | ICD-10-CM | POA: Diagnosis not present

## 2020-11-24 DIAGNOSIS — G309 Alzheimer's disease, unspecified: Secondary | ICD-10-CM | POA: Diagnosis not present

## 2020-11-24 DIAGNOSIS — I5032 Chronic diastolic (congestive) heart failure: Secondary | ICD-10-CM | POA: Diagnosis not present

## 2020-11-27 DIAGNOSIS — I5032 Chronic diastolic (congestive) heart failure: Secondary | ICD-10-CM | POA: Diagnosis not present

## 2020-11-27 DIAGNOSIS — G309 Alzheimer's disease, unspecified: Secondary | ICD-10-CM | POA: Diagnosis not present

## 2020-11-28 DIAGNOSIS — I5032 Chronic diastolic (congestive) heart failure: Secondary | ICD-10-CM | POA: Diagnosis not present

## 2020-11-28 DIAGNOSIS — G309 Alzheimer's disease, unspecified: Secondary | ICD-10-CM | POA: Diagnosis not present

## 2020-11-29 DIAGNOSIS — G309 Alzheimer's disease, unspecified: Secondary | ICD-10-CM | POA: Diagnosis not present

## 2020-11-29 DIAGNOSIS — I5032 Chronic diastolic (congestive) heart failure: Secondary | ICD-10-CM | POA: Diagnosis not present

## 2020-11-30 DIAGNOSIS — G309 Alzheimer's disease, unspecified: Secondary | ICD-10-CM | POA: Diagnosis not present

## 2020-11-30 DIAGNOSIS — I5032 Chronic diastolic (congestive) heart failure: Secondary | ICD-10-CM | POA: Diagnosis not present

## 2020-12-01 DIAGNOSIS — I5032 Chronic diastolic (congestive) heart failure: Secondary | ICD-10-CM | POA: Diagnosis not present

## 2020-12-01 DIAGNOSIS — G309 Alzheimer's disease, unspecified: Secondary | ICD-10-CM | POA: Diagnosis not present

## 2020-12-04 DIAGNOSIS — G309 Alzheimer's disease, unspecified: Secondary | ICD-10-CM | POA: Diagnosis not present

## 2020-12-04 DIAGNOSIS — I5032 Chronic diastolic (congestive) heart failure: Secondary | ICD-10-CM | POA: Diagnosis not present

## 2020-12-05 DIAGNOSIS — G309 Alzheimer's disease, unspecified: Secondary | ICD-10-CM | POA: Diagnosis not present

## 2020-12-05 DIAGNOSIS — I5032 Chronic diastolic (congestive) heart failure: Secondary | ICD-10-CM | POA: Diagnosis not present

## 2020-12-06 DIAGNOSIS — G309 Alzheimer's disease, unspecified: Secondary | ICD-10-CM | POA: Diagnosis not present

## 2020-12-06 DIAGNOSIS — I5032 Chronic diastolic (congestive) heart failure: Secondary | ICD-10-CM | POA: Diagnosis not present

## 2020-12-07 DIAGNOSIS — F0390 Unspecified dementia without behavioral disturbance: Secondary | ICD-10-CM | POA: Diagnosis not present

## 2020-12-07 DIAGNOSIS — I5032 Chronic diastolic (congestive) heart failure: Secondary | ICD-10-CM | POA: Diagnosis not present

## 2020-12-07 DIAGNOSIS — E785 Hyperlipidemia, unspecified: Secondary | ICD-10-CM | POA: Diagnosis not present

## 2020-12-07 DIAGNOSIS — E039 Hypothyroidism, unspecified: Secondary | ICD-10-CM | POA: Diagnosis not present

## 2020-12-07 DIAGNOSIS — G309 Alzheimer's disease, unspecified: Secondary | ICD-10-CM | POA: Diagnosis not present

## 2020-12-07 DIAGNOSIS — I10 Essential (primary) hypertension: Secondary | ICD-10-CM | POA: Diagnosis not present

## 2020-12-08 DIAGNOSIS — I5032 Chronic diastolic (congestive) heart failure: Secondary | ICD-10-CM | POA: Diagnosis not present

## 2020-12-08 DIAGNOSIS — G309 Alzheimer's disease, unspecified: Secondary | ICD-10-CM | POA: Diagnosis not present

## 2020-12-11 DIAGNOSIS — I5032 Chronic diastolic (congestive) heart failure: Secondary | ICD-10-CM | POA: Diagnosis not present

## 2020-12-11 DIAGNOSIS — G309 Alzheimer's disease, unspecified: Secondary | ICD-10-CM | POA: Diagnosis not present

## 2020-12-13 DIAGNOSIS — I5032 Chronic diastolic (congestive) heart failure: Secondary | ICD-10-CM | POA: Diagnosis not present

## 2020-12-13 DIAGNOSIS — G309 Alzheimer's disease, unspecified: Secondary | ICD-10-CM | POA: Diagnosis not present

## 2020-12-14 DIAGNOSIS — M79675 Pain in left toe(s): Secondary | ICD-10-CM | POA: Diagnosis not present

## 2020-12-14 DIAGNOSIS — B351 Tinea unguium: Secondary | ICD-10-CM | POA: Diagnosis not present

## 2020-12-14 DIAGNOSIS — M79674 Pain in right toe(s): Secondary | ICD-10-CM | POA: Diagnosis not present

## 2020-12-15 DIAGNOSIS — I5032 Chronic diastolic (congestive) heart failure: Secondary | ICD-10-CM | POA: Diagnosis not present

## 2020-12-15 DIAGNOSIS — G309 Alzheimer's disease, unspecified: Secondary | ICD-10-CM | POA: Diagnosis not present

## 2020-12-16 DIAGNOSIS — I5032 Chronic diastolic (congestive) heart failure: Secondary | ICD-10-CM | POA: Diagnosis not present

## 2020-12-16 DIAGNOSIS — T148XXA Other injury of unspecified body region, initial encounter: Secondary | ICD-10-CM | POA: Diagnosis not present

## 2020-12-16 DIAGNOSIS — G309 Alzheimer's disease, unspecified: Secondary | ICD-10-CM | POA: Diagnosis not present

## 2020-12-18 DIAGNOSIS — G309 Alzheimer's disease, unspecified: Secondary | ICD-10-CM | POA: Diagnosis not present

## 2020-12-18 DIAGNOSIS — I5032 Chronic diastolic (congestive) heart failure: Secondary | ICD-10-CM | POA: Diagnosis not present

## 2020-12-19 DIAGNOSIS — I5032 Chronic diastolic (congestive) heart failure: Secondary | ICD-10-CM | POA: Diagnosis not present

## 2020-12-19 DIAGNOSIS — G309 Alzheimer's disease, unspecified: Secondary | ICD-10-CM | POA: Diagnosis not present

## 2020-12-20 DIAGNOSIS — I5032 Chronic diastolic (congestive) heart failure: Secondary | ICD-10-CM | POA: Diagnosis not present

## 2020-12-20 DIAGNOSIS — G309 Alzheimer's disease, unspecified: Secondary | ICD-10-CM | POA: Diagnosis not present

## 2020-12-21 DIAGNOSIS — G309 Alzheimer's disease, unspecified: Secondary | ICD-10-CM | POA: Diagnosis not present

## 2020-12-21 DIAGNOSIS — I5032 Chronic diastolic (congestive) heart failure: Secondary | ICD-10-CM | POA: Diagnosis not present

## 2020-12-22 DIAGNOSIS — F22 Delusional disorders: Secondary | ICD-10-CM | POA: Diagnosis not present

## 2020-12-22 DIAGNOSIS — I5032 Chronic diastolic (congestive) heart failure: Secondary | ICD-10-CM | POA: Diagnosis not present

## 2020-12-22 DIAGNOSIS — G300 Alzheimer's disease with early onset: Secondary | ICD-10-CM | POA: Diagnosis not present

## 2020-12-22 DIAGNOSIS — G309 Alzheimer's disease, unspecified: Secondary | ICD-10-CM | POA: Diagnosis not present

## 2020-12-22 DIAGNOSIS — F411 Generalized anxiety disorder: Secondary | ICD-10-CM | POA: Diagnosis not present

## 2020-12-24 DIAGNOSIS — I5032 Chronic diastolic (congestive) heart failure: Secondary | ICD-10-CM | POA: Diagnosis not present

## 2020-12-24 DIAGNOSIS — G309 Alzheimer's disease, unspecified: Secondary | ICD-10-CM | POA: Diagnosis not present

## 2020-12-25 DIAGNOSIS — I5032 Chronic diastolic (congestive) heart failure: Secondary | ICD-10-CM | POA: Diagnosis not present

## 2020-12-25 DIAGNOSIS — G309 Alzheimer's disease, unspecified: Secondary | ICD-10-CM | POA: Diagnosis not present

## 2020-12-26 DIAGNOSIS — I5032 Chronic diastolic (congestive) heart failure: Secondary | ICD-10-CM | POA: Diagnosis not present

## 2020-12-26 DIAGNOSIS — G309 Alzheimer's disease, unspecified: Secondary | ICD-10-CM | POA: Diagnosis not present

## 2020-12-27 DIAGNOSIS — I5032 Chronic diastolic (congestive) heart failure: Secondary | ICD-10-CM | POA: Diagnosis not present

## 2020-12-27 DIAGNOSIS — G309 Alzheimer's disease, unspecified: Secondary | ICD-10-CM | POA: Diagnosis not present

## 2020-12-28 DIAGNOSIS — I10 Essential (primary) hypertension: Secondary | ICD-10-CM | POA: Diagnosis not present

## 2020-12-28 DIAGNOSIS — F0390 Unspecified dementia without behavioral disturbance: Secondary | ICD-10-CM | POA: Diagnosis not present

## 2020-12-28 DIAGNOSIS — I5032 Chronic diastolic (congestive) heart failure: Secondary | ICD-10-CM | POA: Diagnosis not present

## 2020-12-28 DIAGNOSIS — E039 Hypothyroidism, unspecified: Secondary | ICD-10-CM | POA: Diagnosis not present

## 2020-12-28 DIAGNOSIS — G309 Alzheimer's disease, unspecified: Secondary | ICD-10-CM | POA: Diagnosis not present

## 2020-12-29 DIAGNOSIS — M79672 Pain in left foot: Secondary | ICD-10-CM | POA: Diagnosis not present

## 2020-12-29 DIAGNOSIS — I5032 Chronic diastolic (congestive) heart failure: Secondary | ICD-10-CM | POA: Diagnosis not present

## 2020-12-29 DIAGNOSIS — Z043 Encounter for examination and observation following other accident: Secondary | ICD-10-CM | POA: Diagnosis not present

## 2020-12-29 DIAGNOSIS — T148XXA Other injury of unspecified body region, initial encounter: Secondary | ICD-10-CM | POA: Diagnosis not present

## 2020-12-29 DIAGNOSIS — S81812A Laceration without foreign body, left lower leg, initial encounter: Secondary | ICD-10-CM | POA: Diagnosis not present

## 2020-12-29 DIAGNOSIS — F0390 Unspecified dementia without behavioral disturbance: Secondary | ICD-10-CM | POA: Diagnosis not present

## 2020-12-29 DIAGNOSIS — W19XXXA Unspecified fall, initial encounter: Secondary | ICD-10-CM | POA: Diagnosis not present

## 2020-12-29 DIAGNOSIS — G309 Alzheimer's disease, unspecified: Secondary | ICD-10-CM | POA: Diagnosis not present

## 2020-12-30 DIAGNOSIS — R279 Unspecified lack of coordination: Secondary | ICD-10-CM | POA: Diagnosis not present

## 2020-12-30 DIAGNOSIS — R410 Disorientation, unspecified: Secondary | ICD-10-CM | POA: Diagnosis not present

## 2020-12-30 DIAGNOSIS — I5032 Chronic diastolic (congestive) heart failure: Secondary | ICD-10-CM | POA: Diagnosis not present

## 2020-12-30 DIAGNOSIS — W19XXXA Unspecified fall, initial encounter: Secondary | ICD-10-CM | POA: Diagnosis not present

## 2020-12-30 DIAGNOSIS — G309 Alzheimer's disease, unspecified: Secondary | ICD-10-CM | POA: Diagnosis not present

## 2020-12-30 DIAGNOSIS — Z743 Need for continuous supervision: Secondary | ICD-10-CM | POA: Diagnosis not present

## 2020-12-30 DIAGNOSIS — R52 Pain, unspecified: Secondary | ICD-10-CM | POA: Diagnosis not present

## 2021-01-01 DIAGNOSIS — G309 Alzheimer's disease, unspecified: Secondary | ICD-10-CM | POA: Diagnosis not present

## 2021-01-01 DIAGNOSIS — I5032 Chronic diastolic (congestive) heart failure: Secondary | ICD-10-CM | POA: Diagnosis not present

## 2021-01-02 DIAGNOSIS — I5032 Chronic diastolic (congestive) heart failure: Secondary | ICD-10-CM | POA: Diagnosis not present

## 2021-01-02 DIAGNOSIS — G309 Alzheimer's disease, unspecified: Secondary | ICD-10-CM | POA: Diagnosis not present

## 2021-01-03 DIAGNOSIS — G309 Alzheimer's disease, unspecified: Secondary | ICD-10-CM | POA: Diagnosis not present

## 2021-01-03 DIAGNOSIS — I5032 Chronic diastolic (congestive) heart failure: Secondary | ICD-10-CM | POA: Diagnosis not present

## 2021-01-04 DIAGNOSIS — G309 Alzheimer's disease, unspecified: Secondary | ICD-10-CM | POA: Diagnosis not present

## 2021-01-04 DIAGNOSIS — I5032 Chronic diastolic (congestive) heart failure: Secondary | ICD-10-CM | POA: Diagnosis not present

## 2021-01-05 DIAGNOSIS — G309 Alzheimer's disease, unspecified: Secondary | ICD-10-CM | POA: Diagnosis not present

## 2021-01-05 DIAGNOSIS — I5032 Chronic diastolic (congestive) heart failure: Secondary | ICD-10-CM | POA: Diagnosis not present

## 2021-01-08 DIAGNOSIS — G309 Alzheimer's disease, unspecified: Secondary | ICD-10-CM | POA: Diagnosis not present

## 2021-01-08 DIAGNOSIS — I5032 Chronic diastolic (congestive) heart failure: Secondary | ICD-10-CM | POA: Diagnosis not present

## 2021-01-09 DIAGNOSIS — I5032 Chronic diastolic (congestive) heart failure: Secondary | ICD-10-CM | POA: Diagnosis not present

## 2021-01-09 DIAGNOSIS — R296 Repeated falls: Secondary | ICD-10-CM | POA: Diagnosis not present

## 2021-01-09 DIAGNOSIS — G309 Alzheimer's disease, unspecified: Secondary | ICD-10-CM | POA: Diagnosis not present

## 2021-01-09 DIAGNOSIS — R451 Restlessness and agitation: Secondary | ICD-10-CM | POA: Diagnosis not present

## 2021-01-09 DIAGNOSIS — F0390 Unspecified dementia without behavioral disturbance: Secondary | ICD-10-CM | POA: Diagnosis not present

## 2021-01-10 DIAGNOSIS — I5032 Chronic diastolic (congestive) heart failure: Secondary | ICD-10-CM | POA: Diagnosis not present

## 2021-01-10 DIAGNOSIS — G309 Alzheimer's disease, unspecified: Secondary | ICD-10-CM | POA: Diagnosis not present

## 2021-01-11 DIAGNOSIS — G309 Alzheimer's disease, unspecified: Secondary | ICD-10-CM | POA: Diagnosis not present

## 2021-01-11 DIAGNOSIS — I5032 Chronic diastolic (congestive) heart failure: Secondary | ICD-10-CM | POA: Diagnosis not present

## 2021-01-12 DIAGNOSIS — G309 Alzheimer's disease, unspecified: Secondary | ICD-10-CM | POA: Diagnosis not present

## 2021-01-12 DIAGNOSIS — I5032 Chronic diastolic (congestive) heart failure: Secondary | ICD-10-CM | POA: Diagnosis not present

## 2021-01-15 DIAGNOSIS — I5032 Chronic diastolic (congestive) heart failure: Secondary | ICD-10-CM | POA: Diagnosis not present

## 2021-01-15 DIAGNOSIS — G309 Alzheimer's disease, unspecified: Secondary | ICD-10-CM | POA: Diagnosis not present

## 2021-01-17 DIAGNOSIS — G309 Alzheimer's disease, unspecified: Secondary | ICD-10-CM | POA: Diagnosis not present

## 2021-01-17 DIAGNOSIS — I5032 Chronic diastolic (congestive) heart failure: Secondary | ICD-10-CM | POA: Diagnosis not present

## 2021-01-18 DIAGNOSIS — I5032 Chronic diastolic (congestive) heart failure: Secondary | ICD-10-CM | POA: Diagnosis not present

## 2021-01-18 DIAGNOSIS — G309 Alzheimer's disease, unspecified: Secondary | ICD-10-CM | POA: Diagnosis not present

## 2021-01-19 DIAGNOSIS — I5032 Chronic diastolic (congestive) heart failure: Secondary | ICD-10-CM | POA: Diagnosis not present

## 2021-01-19 DIAGNOSIS — E559 Vitamin D deficiency, unspecified: Secondary | ICD-10-CM | POA: Diagnosis not present

## 2021-01-19 DIAGNOSIS — E119 Type 2 diabetes mellitus without complications: Secondary | ICD-10-CM | POA: Diagnosis not present

## 2021-01-19 DIAGNOSIS — F0390 Unspecified dementia without behavioral disturbance: Secondary | ICD-10-CM | POA: Diagnosis not present

## 2021-01-19 DIAGNOSIS — E039 Hypothyroidism, unspecified: Secondary | ICD-10-CM | POA: Diagnosis not present

## 2021-01-19 DIAGNOSIS — G309 Alzheimer's disease, unspecified: Secondary | ICD-10-CM | POA: Diagnosis not present

## 2021-01-19 DIAGNOSIS — E785 Hyperlipidemia, unspecified: Secondary | ICD-10-CM | POA: Diagnosis not present

## 2021-01-19 DIAGNOSIS — I10 Essential (primary) hypertension: Secondary | ICD-10-CM | POA: Diagnosis not present

## 2021-01-19 DIAGNOSIS — D518 Other vitamin B12 deficiency anemias: Secondary | ICD-10-CM | POA: Diagnosis not present

## 2021-01-22 DIAGNOSIS — I5032 Chronic diastolic (congestive) heart failure: Secondary | ICD-10-CM | POA: Diagnosis not present

## 2021-01-22 DIAGNOSIS — D518 Other vitamin B12 deficiency anemias: Secondary | ICD-10-CM | POA: Diagnosis not present

## 2021-01-22 DIAGNOSIS — E7849 Other hyperlipidemia: Secondary | ICD-10-CM | POA: Diagnosis not present

## 2021-01-22 DIAGNOSIS — Z79899 Other long term (current) drug therapy: Secondary | ICD-10-CM | POA: Diagnosis not present

## 2021-01-22 DIAGNOSIS — E559 Vitamin D deficiency, unspecified: Secondary | ICD-10-CM | POA: Diagnosis not present

## 2021-01-22 DIAGNOSIS — E039 Hypothyroidism, unspecified: Secondary | ICD-10-CM | POA: Diagnosis not present

## 2021-01-22 DIAGNOSIS — G309 Alzheimer's disease, unspecified: Secondary | ICD-10-CM | POA: Diagnosis not present

## 2021-01-22 DIAGNOSIS — E119 Type 2 diabetes mellitus without complications: Secondary | ICD-10-CM | POA: Diagnosis not present

## 2021-01-24 DIAGNOSIS — G309 Alzheimer's disease, unspecified: Secondary | ICD-10-CM | POA: Diagnosis not present

## 2021-01-24 DIAGNOSIS — I5032 Chronic diastolic (congestive) heart failure: Secondary | ICD-10-CM | POA: Diagnosis not present

## 2021-01-25 DIAGNOSIS — I5032 Chronic diastolic (congestive) heart failure: Secondary | ICD-10-CM | POA: Diagnosis not present

## 2021-01-25 DIAGNOSIS — G309 Alzheimer's disease, unspecified: Secondary | ICD-10-CM | POA: Diagnosis not present

## 2021-01-26 DIAGNOSIS — G309 Alzheimer's disease, unspecified: Secondary | ICD-10-CM | POA: Diagnosis not present

## 2021-01-26 DIAGNOSIS — Z66 Do not resuscitate: Secondary | ICD-10-CM | POA: Diagnosis not present

## 2021-01-26 DIAGNOSIS — I5032 Chronic diastolic (congestive) heart failure: Secondary | ICD-10-CM | POA: Diagnosis not present

## 2021-01-28 DIAGNOSIS — G309 Alzheimer's disease, unspecified: Secondary | ICD-10-CM | POA: Diagnosis not present

## 2021-01-28 DIAGNOSIS — Z66 Do not resuscitate: Secondary | ICD-10-CM | POA: Diagnosis not present

## 2021-01-28 DIAGNOSIS — I5032 Chronic diastolic (congestive) heart failure: Secondary | ICD-10-CM | POA: Diagnosis not present

## 2021-01-29 DIAGNOSIS — I5032 Chronic diastolic (congestive) heart failure: Secondary | ICD-10-CM | POA: Diagnosis not present

## 2021-01-29 DIAGNOSIS — G309 Alzheimer's disease, unspecified: Secondary | ICD-10-CM | POA: Diagnosis not present

## 2021-01-29 DIAGNOSIS — Z66 Do not resuscitate: Secondary | ICD-10-CM | POA: Diagnosis not present

## 2021-01-30 DIAGNOSIS — G309 Alzheimer's disease, unspecified: Secondary | ICD-10-CM | POA: Diagnosis not present

## 2021-01-30 DIAGNOSIS — Z66 Do not resuscitate: Secondary | ICD-10-CM | POA: Diagnosis not present

## 2021-01-30 DIAGNOSIS — I5032 Chronic diastolic (congestive) heart failure: Secondary | ICD-10-CM | POA: Diagnosis not present

## 2021-01-31 DIAGNOSIS — G309 Alzheimer's disease, unspecified: Secondary | ICD-10-CM | POA: Diagnosis not present

## 2021-01-31 DIAGNOSIS — Z66 Do not resuscitate: Secondary | ICD-10-CM | POA: Diagnosis not present

## 2021-01-31 DIAGNOSIS — I5032 Chronic diastolic (congestive) heart failure: Secondary | ICD-10-CM | POA: Diagnosis not present

## 2021-02-01 DIAGNOSIS — G309 Alzheimer's disease, unspecified: Secondary | ICD-10-CM | POA: Diagnosis not present

## 2021-02-01 DIAGNOSIS — I5032 Chronic diastolic (congestive) heart failure: Secondary | ICD-10-CM | POA: Diagnosis not present

## 2021-02-01 DIAGNOSIS — Z66 Do not resuscitate: Secondary | ICD-10-CM | POA: Diagnosis not present

## 2021-02-02 DIAGNOSIS — I5032 Chronic diastolic (congestive) heart failure: Secondary | ICD-10-CM | POA: Diagnosis not present

## 2021-02-02 DIAGNOSIS — Z66 Do not resuscitate: Secondary | ICD-10-CM | POA: Diagnosis not present

## 2021-02-02 DIAGNOSIS — G309 Alzheimer's disease, unspecified: Secondary | ICD-10-CM | POA: Diagnosis not present

## 2021-02-05 DIAGNOSIS — I5032 Chronic diastolic (congestive) heart failure: Secondary | ICD-10-CM | POA: Diagnosis not present

## 2021-02-05 DIAGNOSIS — G309 Alzheimer's disease, unspecified: Secondary | ICD-10-CM | POA: Diagnosis not present

## 2021-02-05 DIAGNOSIS — Z66 Do not resuscitate: Secondary | ICD-10-CM | POA: Diagnosis not present

## 2021-02-06 DIAGNOSIS — Z66 Do not resuscitate: Secondary | ICD-10-CM | POA: Diagnosis not present

## 2021-02-06 DIAGNOSIS — I5032 Chronic diastolic (congestive) heart failure: Secondary | ICD-10-CM | POA: Diagnosis not present

## 2021-02-06 DIAGNOSIS — G309 Alzheimer's disease, unspecified: Secondary | ICD-10-CM | POA: Diagnosis not present

## 2021-02-07 DIAGNOSIS — Z66 Do not resuscitate: Secondary | ICD-10-CM | POA: Diagnosis not present

## 2021-02-07 DIAGNOSIS — G309 Alzheimer's disease, unspecified: Secondary | ICD-10-CM | POA: Diagnosis not present

## 2021-02-07 DIAGNOSIS — I5032 Chronic diastolic (congestive) heart failure: Secondary | ICD-10-CM | POA: Diagnosis not present

## 2021-02-08 DIAGNOSIS — E785 Hyperlipidemia, unspecified: Secondary | ICD-10-CM | POA: Diagnosis not present

## 2021-02-08 DIAGNOSIS — I10 Essential (primary) hypertension: Secondary | ICD-10-CM | POA: Diagnosis not present

## 2021-02-08 DIAGNOSIS — E039 Hypothyroidism, unspecified: Secondary | ICD-10-CM | POA: Diagnosis not present

## 2021-02-08 DIAGNOSIS — F0390 Unspecified dementia without behavioral disturbance: Secondary | ICD-10-CM | POA: Diagnosis not present

## 2021-02-09 DIAGNOSIS — I5032 Chronic diastolic (congestive) heart failure: Secondary | ICD-10-CM | POA: Diagnosis not present

## 2021-02-09 DIAGNOSIS — Z66 Do not resuscitate: Secondary | ICD-10-CM | POA: Diagnosis not present

## 2021-02-09 DIAGNOSIS — G309 Alzheimer's disease, unspecified: Secondary | ICD-10-CM | POA: Diagnosis not present

## 2021-02-12 DIAGNOSIS — Z66 Do not resuscitate: Secondary | ICD-10-CM | POA: Diagnosis not present

## 2021-02-12 DIAGNOSIS — G309 Alzheimer's disease, unspecified: Secondary | ICD-10-CM | POA: Diagnosis not present

## 2021-02-12 DIAGNOSIS — I5032 Chronic diastolic (congestive) heart failure: Secondary | ICD-10-CM | POA: Diagnosis not present

## 2021-02-14 DIAGNOSIS — Z66 Do not resuscitate: Secondary | ICD-10-CM | POA: Diagnosis not present

## 2021-02-14 DIAGNOSIS — G309 Alzheimer's disease, unspecified: Secondary | ICD-10-CM | POA: Diagnosis not present

## 2021-02-14 DIAGNOSIS — I5032 Chronic diastolic (congestive) heart failure: Secondary | ICD-10-CM | POA: Diagnosis not present

## 2021-02-15 DIAGNOSIS — I5032 Chronic diastolic (congestive) heart failure: Secondary | ICD-10-CM | POA: Diagnosis not present

## 2021-02-15 DIAGNOSIS — G309 Alzheimer's disease, unspecified: Secondary | ICD-10-CM | POA: Diagnosis not present

## 2021-02-15 DIAGNOSIS — Z66 Do not resuscitate: Secondary | ICD-10-CM | POA: Diagnosis not present

## 2021-02-16 DIAGNOSIS — G309 Alzheimer's disease, unspecified: Secondary | ICD-10-CM | POA: Diagnosis not present

## 2021-02-16 DIAGNOSIS — Z66 Do not resuscitate: Secondary | ICD-10-CM | POA: Diagnosis not present

## 2021-02-16 DIAGNOSIS — I5032 Chronic diastolic (congestive) heart failure: Secondary | ICD-10-CM | POA: Diagnosis not present

## 2021-02-20 DIAGNOSIS — G309 Alzheimer's disease, unspecified: Secondary | ICD-10-CM | POA: Diagnosis not present

## 2021-02-20 DIAGNOSIS — I5032 Chronic diastolic (congestive) heart failure: Secondary | ICD-10-CM | POA: Diagnosis not present

## 2021-02-20 DIAGNOSIS — Z66 Do not resuscitate: Secondary | ICD-10-CM | POA: Diagnosis not present

## 2021-02-21 DIAGNOSIS — R296 Repeated falls: Secondary | ICD-10-CM | POA: Diagnosis not present

## 2021-02-21 DIAGNOSIS — E785 Hyperlipidemia, unspecified: Secondary | ICD-10-CM | POA: Diagnosis not present

## 2021-02-21 DIAGNOSIS — E039 Hypothyroidism, unspecified: Secondary | ICD-10-CM | POA: Diagnosis not present

## 2021-02-21 DIAGNOSIS — I10 Essential (primary) hypertension: Secondary | ICD-10-CM | POA: Diagnosis not present

## 2021-02-21 DIAGNOSIS — W19XXXA Unspecified fall, initial encounter: Secondary | ICD-10-CM | POA: Diagnosis not present

## 2021-02-21 DIAGNOSIS — D518 Other vitamin B12 deficiency anemias: Secondary | ICD-10-CM | POA: Diagnosis not present

## 2021-02-21 DIAGNOSIS — R404 Transient alteration of awareness: Secondary | ICD-10-CM | POA: Diagnosis not present

## 2021-02-21 DIAGNOSIS — R58 Hemorrhage, not elsewhere classified: Secondary | ICD-10-CM | POA: Diagnosis not present

## 2021-02-21 DIAGNOSIS — T699XXA Effect of reduced temperature, unspecified, initial encounter: Secondary | ICD-10-CM | POA: Diagnosis not present

## 2021-02-21 DIAGNOSIS — E119 Type 2 diabetes mellitus without complications: Secondary | ICD-10-CM | POA: Diagnosis not present

## 2021-02-21 DIAGNOSIS — W1839XA Other fall on same level, initial encounter: Secondary | ICD-10-CM | POA: Diagnosis not present

## 2021-02-21 DIAGNOSIS — Z743 Need for continuous supervision: Secondary | ICD-10-CM | POA: Diagnosis not present

## 2021-02-21 DIAGNOSIS — S0990XA Unspecified injury of head, initial encounter: Secondary | ICD-10-CM | POA: Diagnosis not present

## 2021-02-21 DIAGNOSIS — E559 Vitamin D deficiency, unspecified: Secondary | ICD-10-CM | POA: Diagnosis not present

## 2021-02-21 DIAGNOSIS — Y998 Other external cause status: Secondary | ICD-10-CM | POA: Diagnosis not present

## 2021-02-21 DIAGNOSIS — S0083XA Contusion of other part of head, initial encounter: Secondary | ICD-10-CM | POA: Diagnosis not present

## 2021-02-21 DIAGNOSIS — F0390 Unspecified dementia without behavioral disturbance: Secondary | ICD-10-CM | POA: Diagnosis not present

## 2021-02-21 DIAGNOSIS — R9431 Abnormal electrocardiogram [ECG] [EKG]: Secondary | ICD-10-CM | POA: Diagnosis not present

## 2021-02-22 DIAGNOSIS — G309 Alzheimer's disease, unspecified: Secondary | ICD-10-CM | POA: Diagnosis not present

## 2021-02-22 DIAGNOSIS — I5032 Chronic diastolic (congestive) heart failure: Secondary | ICD-10-CM | POA: Diagnosis not present

## 2021-02-22 DIAGNOSIS — Z66 Do not resuscitate: Secondary | ICD-10-CM | POA: Diagnosis not present

## 2021-02-23 ENCOUNTER — Emergency Department (HOSPITAL_COMMUNITY)

## 2021-02-23 ENCOUNTER — Other Ambulatory Visit: Payer: Self-pay

## 2021-02-23 ENCOUNTER — Encounter (HOSPITAL_COMMUNITY): Payer: Self-pay

## 2021-02-23 ENCOUNTER — Emergency Department (HOSPITAL_COMMUNITY)
Admission: EM | Admit: 2021-02-23 | Discharge: 2021-02-25 | Disposition: A | Discharge status: Home / Self Care | Attending: Emergency Medicine | Admitting: Emergency Medicine

## 2021-02-23 DIAGNOSIS — N183 Chronic kidney disease, stage 3 unspecified: Secondary | ICD-10-CM | POA: Insufficient documentation

## 2021-02-23 DIAGNOSIS — E1122 Type 2 diabetes mellitus with diabetic chronic kidney disease: Secondary | ICD-10-CM | POA: Diagnosis not present

## 2021-02-23 DIAGNOSIS — R404 Transient alteration of awareness: Secondary | ICD-10-CM | POA: Diagnosis not present

## 2021-02-23 DIAGNOSIS — Z743 Need for continuous supervision: Secondary | ICD-10-CM | POA: Diagnosis not present

## 2021-02-23 DIAGNOSIS — E039 Hypothyroidism, unspecified: Secondary | ICD-10-CM | POA: Diagnosis not present

## 2021-02-23 DIAGNOSIS — W19XXXA Unspecified fall, initial encounter: Secondary | ICD-10-CM | POA: Diagnosis not present

## 2021-02-23 DIAGNOSIS — T148XXA Other injury of unspecified body region, initial encounter: Secondary | ICD-10-CM

## 2021-02-23 DIAGNOSIS — S0083XA Contusion of other part of head, initial encounter: Secondary | ICD-10-CM | POA: Insufficient documentation

## 2021-02-23 DIAGNOSIS — Z951 Presence of aortocoronary bypass graft: Secondary | ICD-10-CM | POA: Diagnosis not present

## 2021-02-23 DIAGNOSIS — Z7982 Long term (current) use of aspirin: Secondary | ICD-10-CM | POA: Diagnosis not present

## 2021-02-23 DIAGNOSIS — I10 Essential (primary) hypertension: Secondary | ICD-10-CM | POA: Diagnosis not present

## 2021-02-23 DIAGNOSIS — R296 Repeated falls: Secondary | ICD-10-CM | POA: Diagnosis not present

## 2021-02-23 DIAGNOSIS — F0391 Unspecified dementia with behavioral disturbance: Secondary | ICD-10-CM | POA: Diagnosis not present

## 2021-02-23 DIAGNOSIS — I129 Hypertensive chronic kidney disease with stage 1 through stage 4 chronic kidney disease, or unspecified chronic kidney disease: Secondary | ICD-10-CM | POA: Diagnosis not present

## 2021-02-23 DIAGNOSIS — Z9660 Presence of unspecified orthopedic joint implant: Secondary | ICD-10-CM | POA: Insufficient documentation

## 2021-02-23 DIAGNOSIS — I2510 Atherosclerotic heart disease of native coronary artery without angina pectoris: Secondary | ICD-10-CM | POA: Diagnosis not present

## 2021-02-23 DIAGNOSIS — R410 Disorientation, unspecified: Secondary | ICD-10-CM | POA: Diagnosis not present

## 2021-02-23 DIAGNOSIS — S0003XA Contusion of scalp, initial encounter: Secondary | ICD-10-CM | POA: Diagnosis not present

## 2021-02-23 DIAGNOSIS — Z79899 Other long term (current) drug therapy: Secondary | ICD-10-CM | POA: Insufficient documentation

## 2021-02-23 DIAGNOSIS — S0990XA Unspecified injury of head, initial encounter: Secondary | ICD-10-CM | POA: Diagnosis present

## 2021-02-23 LAB — COMPREHENSIVE METABOLIC PANEL
ALT: 9 U/L (ref 0–44)
AST: 12 U/L — ABNORMAL LOW (ref 15–41)
Albumin: 3.2 g/dL — ABNORMAL LOW (ref 3.5–5.0)
Alkaline Phosphatase: 65 U/L (ref 38–126)
Anion gap: 8 (ref 5–15)
BUN: 23 mg/dL (ref 8–23)
CO2: 27 mmol/L (ref 22–32)
Calcium: 8.8 mg/dL — ABNORMAL LOW (ref 8.9–10.3)
Chloride: 108 mmol/L (ref 98–111)
Creatinine, Ser: 1.2 mg/dL — ABNORMAL HIGH (ref 0.44–1.00)
GFR, Estimated: 42 mL/min — ABNORMAL LOW (ref >60)
Glucose, Bld: 100 mg/dL — ABNORMAL HIGH (ref 70–99)
Potassium: 3.9 mmol/L (ref 3.5–5.1)
Sodium: 143 mmol/L (ref 135–145)
Total Bilirubin: 0.4 mg/dL (ref 0.3–1.2)
Total Protein: 7 g/dL (ref 6.5–8.1)

## 2021-02-23 LAB — CBC WITH DIFFERENTIAL/PLATELET
Abs Immature Granulocytes: 0.07 10*3/uL (ref 0.00–0.07)
Basophils Absolute: 0.1 10*3/uL (ref 0.0–0.1)
Basophils Relative: 1 %
Eosinophils Absolute: 0.3 10*3/uL (ref 0.0–0.5)
Eosinophils Relative: 3 %
HCT: 37.1 % (ref 36.0–46.0)
Hemoglobin: 11.8 g/dL — ABNORMAL LOW (ref 12.0–15.0)
Immature Granulocytes: 1 %
Lymphocytes Relative: 16 %
Lymphs Abs: 1.6 10*3/uL (ref 0.7–4.0)
MCH: 29.1 pg (ref 26.0–34.0)
MCHC: 31.8 g/dL (ref 30.0–36.0)
MCV: 91.6 fL (ref 80.0–100.0)
Monocytes Absolute: 0.9 10*3/uL (ref 0.1–1.0)
Monocytes Relative: 9 %
Neutro Abs: 7.5 10*3/uL (ref 1.7–7.7)
Neutrophils Relative %: 70 %
Platelets: 282 10*3/uL (ref 150–400)
RBC: 4.05 MIL/uL (ref 3.87–5.11)
RDW: 15.3 % (ref 11.5–15.5)
WBC: 10.5 10*3/uL (ref 4.0–10.5)
nRBC: 0 % (ref 0.0–0.2)

## 2021-02-23 LAB — URINALYSIS, ROUTINE W REFLEX MICROSCOPIC
Bilirubin Urine: NEGATIVE
Glucose, UA: NEGATIVE mg/dL
Hgb urine dipstick: NEGATIVE
Ketones, ur: NEGATIVE mg/dL
Leukocytes,Ua: NEGATIVE
Nitrite: NEGATIVE
Protein, ur: NEGATIVE mg/dL
Specific Gravity, Urine: 1.01 (ref 1.005–1.030)
pH: 7 (ref 5.0–8.0)

## 2021-02-23 LAB — AMMONIA: Ammonia: 21 umol/L (ref 9–35)

## 2021-02-23 MED ORDER — CLONAZEPAM 0.125 MG PO TBDP
0.2500 mg | ORAL_TABLET | Freq: Three times a day (TID) | ORAL | Status: DC | PRN
  Administered 2021-02-24: 0.25 mg via ORAL
  Filled 2021-02-23 (×2): qty 2

## 2021-02-23 MED ORDER — HYDROCHLOROTHIAZIDE 25 MG PO TABS: 25.0000 mg | ORAL_TABLET | Freq: Every day | ORAL | Status: DC

## 2021-02-23 MED ORDER — LORAZEPAM 2 MG/ML IJ SOLN
0.5000 mg | Freq: Once | INTRAMUSCULAR | Status: AC
  Administered 2021-02-23: 0.5 mg via INTRAVENOUS
  Filled 2021-02-23: qty 1

## 2021-02-23 MED ORDER — LEVOTHYROXINE SODIUM 25 MCG PO TABS: 125.0000 ug | ORAL_TABLET | Freq: Every day | ORAL | Status: DC

## 2021-02-23 MED ORDER — AMLODIPINE BESYLATE 5 MG PO TABS
2.5000 mg | ORAL_TABLET | Freq: Every day | ORAL | Status: DC
  Administered 2021-02-24: 2.5 mg via ORAL
  Filled 2021-02-23: qty 1

## 2021-02-23 NOTE — ED Provider Notes (Signed)
  Physical Exam  BP (!) 133/100   Pulse 83   Temp 98 F (36.7 C) (Oral)   Resp 15   Ht 5\' 2"  (1.575 m)   Wt 55 kg   SpO2 99%   BMI 22.18 kg/m   Physical Exam  ED Course/Procedures     Procedures  MDM  Patient care assumed at 3 PM.  Patient was recently in a memory care unit.  The daughter took her out of it several days ago and was unhappy with the care there.  Since then she has been falling.  She fell several days ago and went to [facility] but since patient is hospice patient, no scans were done.  She had another fall again. CT head was ordered.  Case management was involved and physical therapy was ordered  10:03 PM PT saw patient and patient is not a candidate for rehab.  CT head unremarkable. Patient apparently still on hospice.  However she has no hospital bed or lift at home as she is high fall risk.  I ordered her daily meds.  Case management will need to follow-up with patient tomorrow.  Does not meet admission criteria now      [facility], MD 02/23/21 [id number]

## 2021-02-23 NOTE — ED Notes (Signed)
Dr. Silverio Lay notified about patients elevated blood pressure.

## 2021-02-23 NOTE — ED Provider Notes (Signed)
[facility]-EMERGENCY DEPT Provider Note   CSN: [id number] Arrival date & time: 02/23/21  1325     History Chief Complaint  Patient presents with  . Fall    [name] is a 87 y.o. female.  Presents to ER with concern for frequent falls, intermittent agitation.  Daughter reports that patient was residing in a memory care unit and she was extremely unhappy with the care that she was worse evening.  Concern for frequent falls.  Daughter reports that she recently removed her mother from that facility and brought her home.  She has had a very difficult time caring for her mother since being at home.  States that she has slid out of the chair multiple times.  Does not think that she hit her head today.  States that patient was confused and combative, now patient is calm.  States that mother has very advanced dementia and is enrolled in hospice services.  Goal is comfort except interested in giving her blood pressure medications.  HPI     Past Medical History:  Diagnosis Date  . Bradycardia 09/21/2013   Heart rate of 48 noted on the EKG, while on beta blocker therapy (metoprolol 75 mg twice a day). Medication dosage decrease was undertaken   . CAD (coronary artery disease)   . 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 f/u with Dr Katrinka BlazingSmith, amlodipine decreased. `E1o3L`discussed with Dr Everlene OtherBouska   . Chronic anticoagulation 05/13/2017  . Coronary artery disease involving coronary bypass graft of native heart with angina pectoris (HCC) 09/21/2013   CABG 1993, LIMA to LAD, SVG to OM2 and 3, SVG to PDA   . Diabetes (HCC)   . Diabetes mellitus, type 2 (HCC) 02/15/2014   Last Assessment & Plan:  Diabetes is improving with treatment.  Continue current treatment regimen. Diabetes will be reassessed in 3 months     The goal is for the Hgb A1C to be less than 7.0.  It is recommended that all diabetics are educated on and follow a  healthy diabetic diet, exercise for 30 minutes 3-4 times per week (walking, biking, swimming, or machine), monitor blood glucose readings an  . Essential hypertension, benign 09/21/2013  . H/O deep venous thrombosis 10/23/2012   Overview:  hx of dvt tx 2012 with coumadin following surgery   . Hip fracture (HCC)   . History of cardiovascular surgery 10/23/2012   Overview:  1993. CABG> with LIMA to LAD, sequential SVG to oM, SVG to PDA> followed by Dr. Katrinka BlazingSmith.   Marland Kitchen. HTN (hypertension)   . Hypercholesteremia   . Hyperlipidemia   . Hypothyroidism   . Hypothyroidism   . Osteoarthritis   . Osteopenia   . Osteoporosis   . Paroxysmal atrial flutter 05/13/2017    Patient Active Problem List   Diagnosis Date Noted  . Closed fracture of distal end of right femur (HCC) 06/27/2018  . CKD (chronic kidney disease), stage III (HCC) 06/27/2018  . Prolonged QT interval 06/27/2018  . Closed fracture of right femur, initial encounter (HCC) 06/27/2018  . Paroxysmal atrial flutter 05/13/2017  . Diabetes mellitus, type 2 (HCC) 02/15/2014  . Difficulty hearing 02/14/2014  . Problems influencing health status 02/14/2014  . BMI (body mass index), pediatric, 5% to less than 85% for age 82/20/2015  . Coronary artery disease involving coronary bypass graft of native heart with angina pectoris (HCC) 09/21/2013    Class: Chronic  . Essential hypertension, benign 09/21/2013  .  Hyperlipidemia 09/21/2013  . Bradycardia 09/21/2013    Class: Chronic  . Adult hypothyroidism 08/17/2013  . Routine general medical examination at a health care facility 08/28/2012    Past Surgical History:  Procedure Laterality Date  . APPENDECTOMY    . CORONARY ARTERY BYPASS GRAFT     with LIMA to LAD,sequential SVG to OM # 1 & 3, SVG to PDA. All grafts patent with normal LV function 2006, followed by cardiologist Dr. Katrinka Blazing  . EYE SURGERY    . FRACTURE SURGERY    . HEMORRHOID SURGERY    . JOINT REPLACEMENT    . ORIF FEMUR  FRACTURE Right 06/28/2018   Procedure: OPEN REDUCTION INTERNAL FIXATION (ORIF) DISTAL FEMUR FRACTURE;  Surgeon: Roby Lofts, MD;  Location: [facility] OR;  Service: Orthopedics;  Laterality: Right;     OB History   No obstetric history on file.     Family History  Problem Relation Age of Onset  . Heart disease Mother   . Hyperlipidemia Mother   . Stroke Mother   . Alzheimer's disease Brother     Social History   Tobacco Use  . Smoking status: Never Smoker  . Smokeless tobacco: Never Used  Vaping Use  . Vaping Use: Never used  Substance Use Topics  . Alcohol use: No    Alcohol/week: 0.0 standard drinks  . Drug use: No    Home Medications Prior to Admission medications   Medication Sig Start Date End Date Taking? Authorizing Provider  acetaminophen (TYLENOL) 325 MG tablet Take 2 tablets (650 mg total) by mouth every 6 (six) hours as needed for mild pain or headache. 06/30/18  Yes Regalado, [name] A, MD  amLODipine (NORVASC) 2.5 MG tablet Take 2.5 mg by mouth daily.   Yes [provider]  aspirin 325 MG tablet Take 1 tablet (325 mg total) by mouth daily. Patient not taking: Reported on 02/23/2021 06/30/18   Regalado, Jon Billings A, MD  clonazePAM (KLONOPIN) 0.25 MG disintegrating tablet Take 1 tablet (0.25 mg total) by mouth 3 (three) times daily as needed (agitation). Patient not taking: Reported on 02/23/2021 07/01/18   Albertine Grates, MD  feeding supplement (ENSURE SURGERY) LIQD Take 237 mLs by mouth 2 (two) times daily between meals. Patient not taking: Reported on 02/23/2021 06/30/18   Regalado, Jon Billings A, MD  hydrochlorothiazide (HYDRODIURIL) 25 MG tablet Take 25 mg by mouth daily.    [provider]  HYDROcodone-acetaminophen (NORCO/VICODIN) 5-325 MG tablet Take 1 tablet by mouth every 6 (six) hours as needed for moderate pain. Patient not taking: Reported on 02/23/2021 06/30/18   Regalado, Jon Billings A, MD  levothyroxine (SYNTHROID, LEVOTHROID) 125 MCG tablet Take 1 tablet (125 mcg  total) by mouth daily before breakfast. Patient not taking: Reported on 02/23/2021 06/30/18   Regalado, Jon Billings A, MD  metoprolol tartrate (LOPRESSOR) 25 MG tablet Take 0.5 tablets (12.5 mg total) by mouth 2 (two) times daily. Patient not taking: Reported on 02/23/2021 06/30/18   Hartley Barefoot A, MD    Allergies    Ace inhibitors  Review of Systems   Review of Systems  Unable to perform ROS: Dementia    Physical Exam Updated Vital Signs BP 140/81 (BP Location: Left Arm)   Pulse 60   Temp 98 F (36.7 C) (Oral)   Resp 15   Ht 5\' 2"  (1.575 m)   Wt 55 kg   SpO2 100%   BMI 22.18 kg/m   Physical Exam Vitals and nursing note reviewed.  Constitutional:  General: She is not in acute distress.    Appearance: She is well-developed.     Comments: Generally confused, pleasantly demented, elderly  HENT:     Head: Normocephalic.     Comments: Superficial hematoma to forehead, no laceration Eyes:     Conjunctiva/sclera: Conjunctivae normal.  Cardiovascular:     Rate and Rhythm: Normal rate and regular rhythm.     Heart sounds: No murmur heard.   Pulmonary:     Effort: Pulmonary effort is normal. No respiratory distress.     Breath sounds: Normal breath sounds.  Abdominal:     Palpations: Abdomen is soft.     Tenderness: There is no abdominal tenderness.  Musculoskeletal:     Cervical back: Neck supple.  Skin:    General: Skin is warm and dry.  Neurological:     Mental Status: She is alert.     Comments: Will follow commands, opens eyes, moves all 4 extremities, baseline dementia     ED Results / Procedures / Treatments   Labs (all labs ordered are listed, but only abnormal results are displayed) Labs Reviewed  CBC WITH DIFFERENTIAL/PLATELET - Abnormal; Notable for the following components:      Result Value   Hemoglobin 11.8 (*)    All other components within normal limits  COMPREHENSIVE METABOLIC PANEL - Abnormal; Notable for the following components:   Glucose,  Bld 100 (*)    Creatinine, Ser 1.20 (*)    Calcium 8.8 (*)    Albumin 3.2 (*)    AST 12 (*)    GFR, Estimated 42 (*)    All other components within normal limits  URINALYSIS, ROUTINE W REFLEX MICROSCOPIC - Abnormal; Notable for the following components:   APPearance HAZY (*)    All other components within normal limits  AMMONIA    EKG EKG Interpretation  Date/Time:  Friday February 23 2021 14:29:23 EDT Ventricular Rate:  70 PR Interval:  178 QRS Duration: 86 QT Interval:  472 QTC Calculation: 509 R Axis:   112 Text Interpretation: Normal sinus rhythm Lateral infarct , age undetermined Inferior infarct , age undetermined Abnormal ECG No significant change since last tracing Confirmed by ean Canal ([id number]) on 02/23/2021 3:15:22 PM   Radiology CT Head Wo Contrast  Result Date: 02/23/2021 CLINICAL DATA:  Delirium, multiple falls. EXAM: CT HEAD WITHOUT CONTRAST TECHNIQUE: Contiguous axial images were obtained from the base of the skull through the vertex without intravenous contrast. COMPARISON:  Head CT dated 12/29/2020. FINDINGS: Brain: Chronic small vessel ischemic changes within the bilateral periventricular subcortical white matter regions. Ventricles are stable in size. No mass, hemorrhage, edema or other evidence of acute parenchymal abnormality. No extra-axial hemorrhage. Vascular: Chronic calcified atherosclerotic changes of the large vessels at the skull base. No unexpected hyperdense vessel. Skull: Normal. Negative for fracture or focal lesion. Sinuses/Orbits: No acute findings. Other: Focal scalp hematoma overlying the midline frontal bones. No underlying fracture. IMPRESSION: 1. Focal scalp hematoma overlying the midline frontal bones. No underlying fracture. 2. No acute intracranial abnormality. No intracranial mass, hemorrhage or edema. 3. Chronic small vessel ischemic changes in the white matter. Electronically Signed   By: Bary  M.D.   On: 02/23/2021 16:22     Procedures Procedures   Medications Ordered in ED Medications  clonazepam (KLONOPIN) disintegrating tablet 0.25 mg (0.25 mg Oral Patient Refused/Not Given 02/23/21 2257)  amLODipine (NORVASC) tablet 2.5 mg (has no administration in time range)  LORazepam (ATIVAN) injection 0.5 mg (0.5 mg  Intravenous Given 02/23/21 2320)    ED Course  I have reviewed the triage vital signs and the nursing notes.  Pertinent labs & imaging results that were available during my care of the patient were reviewed by me and considered in my medical decision making (see chart for details).    MDM Rules/Calculators/A&P                         87 year old lady presented to ER with concern for intermittent combative/agitated behavior and frequent falls.  She has advanced dementia and was recently taken home by mother from her nursing care facility.  We will check basic labs, head CT.  Will contact transitions of care to meet with daughter.  While awaiting medical work-up, signed out to Dr. Silverio Lay. See his note for final plan and disposition.  Final Clinical Impression(s) / ED Diagnoses Final diagnoses:  Dementia with behavioral disturbance, unspecified dementia type (HCC)  Hematoma  Fall, initial encounter    Rx / DC Orders ED Discharge Orders    None       Milagros Loll, MD 02/24/21 [phone]
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
## 2021-02-23 NOTE — ED Notes (Signed)
MSE signed x2 Nurses due to hx of dementia

## 2021-02-23 NOTE — Progress Notes (Addendum)
.   Transition of Care [facility]) - Emergency Department Mini Assessment   Patient Details  Name: [name] MRN: [id number] Date of Birth: 1925/11/01  Transition of Care [facility]) CM/SW Contact:    Elliot Cousin, RN Phone Number: [phone] 02/23/2021, 9:49 PM   Clinical Narrative: TOC CM spoke to pt's dtr and she wanting SNF placement for long term care. Pt was at a [facility], states she was having multiple falls and she took pt out of facility and brought her home with Home Hospice with [facility]. States she does not have hospital bed, hoyer lift or wheelchair at home. She has bedside commode and RW. PT did complete evaluation and did not meet qualifications for SNF rehab. Dtr states she will need help with pt at home. Discussed she hire private duty caregiver.   900 pm TOC CM spoke to dtr and explained she will need to consider home with hospice. States she is already set up with Home Hospice with [facility], rep Despard RN # [phone]. TOC CM sent text to Hospice RN and states she will discuss with director on tomorrow. States dtr had discussed with her that she is not able to take of pt at home. Explained dtr is wanting to keep her home and order DME, hospital bed, hoyer lift and wheelchair. She will follow up with [facility] CM/CSW tomorrow. ED provider and ED RN updated.    ED Mini Assessment: What brought you to the Emergency Department? : fall, weakness  Barriers to Discharge: ED DME delivery  Barrier interventions: arrange Home Hospice     Interventions which prevented an admission or readmission: Other (must enter comment),SNF Placement    Patient Contact and Communications        ,                 Admission diagnosis:  UTI Patient Active Problem List   Diagnosis Date Noted  . Closed fracture of distal end of right femur (HCC) 06/27/2018  . CKD (chronic kidney disease), stage III (HCC) 06/27/2018  . Prolonged  QT interval 06/27/2018  . Closed fracture of right femur, initial encounter (HCC) 06/27/2018  . Paroxysmal atrial flutter 05/13/2017  . Diabetes mellitus, type 2 (HCC) 02/15/2014  . Difficulty hearing 02/14/2014  . Problems influencing health status 02/14/2014  . BMI (body mass index), pediatric, 5% to less than 85% for age 33/20/2015  . Coronary artery disease involving coronary bypass graft of native heart with angina pectoris (HCC) 09/21/2013    Class: Chronic  . Essential hypertension, benign 09/21/2013  . Hyperlipidemia 09/21/2013  . Bradycardia 09/21/2013    Class: Chronic  . Adult hypothyroidism 08/17/2013  . Routine general medical examination at a health care facility 08/28/2012   PCP:  Henrine Screws, MD Pharmacy:   [facility] [phone] Ginette Otto,  - [address] AT [facility] [address] [place] [address] Phone: [phone] Fax: [phone]

## 2021-02-23 NOTE — ED Triage Notes (Signed)
BIB ems from home. Fall x2 since 0200. daughter states patient started becoming confused and combative at 0200 and was swinging at daughter and didn't know who she was. Patient has old hematoma to face from fall at facility that she use to reside at. Per daughter strong odor to uring x2 days. Ems admin NS. [id number]

## 2021-02-23 NOTE — ED Notes (Signed)
Attempted to administer medication orally. Patient refusing the clonazepam saying "I dont want any of that. No get that away from me." Notifying Dr. Silverio Lay to get order for IV medication administration.

## 2021-02-23 NOTE — Progress Notes (Signed)
PT Cancellation Note  Patient Details Name: [name] MRN: [id number] DOB: 1926/05/21   Cancelled Treatment:    Reason Eval/Treat Not Completed: Medical issues which prohibited therapy  Pt admitted with fall. No H and P in chart yet.  Additionally, with Head CT and Cspine CT pending.  Not stable for PT eval at least until imaging complete to r/o acute changes/fractures/bleeds/etc.  Anise Salvo, PT Acute Rehab Services Pager [phone] [facility] [phone]   Rayetta Humphrey 02/23/2021, 3:34 PM
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
## 2021-02-23 NOTE — ED Notes (Signed)
Patient pulled out purewick. York Spaniel "get that mess away from me now" while gritting her teeth.

## 2021-02-24 DIAGNOSIS — Z66 Do not resuscitate: Secondary | ICD-10-CM | POA: Diagnosis not present

## 2021-02-24 DIAGNOSIS — I5032 Chronic diastolic (congestive) heart failure: Secondary | ICD-10-CM | POA: Diagnosis not present

## 2021-02-24 DIAGNOSIS — G309 Alzheimer's disease, unspecified: Secondary | ICD-10-CM | POA: Diagnosis not present

## 2021-02-24 LAB — CBG MONITORING, ED: Glucose-Capillary: 90 mg/dL (ref 70–99)

## 2021-02-24 MED ORDER — LORAZEPAM 2 MG/ML IJ SOLN
0.5000 mg | Freq: Once | INTRAMUSCULAR | Status: AC
  Administered 2021-02-24: 0.5 mg via INTRAVENOUS
  Filled 2021-02-24: qty 1

## 2021-02-24 NOTE — ED Notes (Addendum)
Patient's daughter called and states that she would have come an gotten her mom at anytime today, but she did not know that was an option. She is very upset stating that "her mother has been waiting on [facility] transport for over 8 hours." She is requesting to cancel [facility] transport and states that she will come pick up her mother in the morning.

## 2021-02-24 NOTE — ED Notes (Signed)
Pt brief changed, purewick replaced. Pads and linen changed. Pt agitated. Repositioned. Lights dimmed. Fall alarm in place.

## 2021-02-24 NOTE — Progress Notes (Signed)
CSW called [facility] RN Toni Amend @ [phone] to discuss case. Left message requesting callback.

## 2021-02-24 NOTE — Progress Notes (Addendum)
12:05 Update CSW received call from [facility] that bed and wheelchair have been ordered for STAT delivery. Hospice worker will update CSW when bed has arrived so that Pt can d/c.    CSW reached [facility] on-call manager, Nakima @[phone] to further investigate the issue of hospital bed and wheelchair. Per [phone], Pt's daughter bed needs to be replaced due to disrepair.  [name] will investigate and update CSW.

## 2021-02-24 NOTE — ED Notes (Signed)
Pt sleeping in stretcher intermittently. Repositioned in bed. Breathing even and unlabored. NAD.

## 2021-02-24 NOTE — ED Notes (Addendum)
Patients chest visualized rising and falling.

## 2021-02-24 NOTE — ED Notes (Signed)
[facility] notified of transport, unknown ETA 

## 2021-02-24 NOTE — ED Notes (Signed)
Patient calm, resting comfortably in bed at this time.

## 2021-02-24 NOTE — Progress Notes (Addendum)
Pt will be going to live with daughter Jolyne Loa at   [address] [place], [place] [address]  DME has been delivered to daughter's home by [facility].  Daughter has aide arriving tomorrow to assist with care.

## 2021-02-24 NOTE — ED Notes (Signed)
Patient cleaned up, changed into new gown and new purewick.
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
## 2021-02-25 NOTE — ED Notes (Signed)
Spoke to daughter Clint Lipps, confirmed she will be here by 9am

## 2021-03-02 DIAGNOSIS — R4182 Altered mental status, unspecified: Secondary | ICD-10-CM | POA: Diagnosis not present

## 2021-03-02 DIAGNOSIS — R0902 Hypoxemia: Secondary | ICD-10-CM | POA: Diagnosis not present
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
## 2022-10-28 DEATH — deceased
